# Patient Record
Sex: Female | Born: 1995 | Race: White | Hispanic: No | Marital: Married | Smoking: Never smoker
Health system: Southern US, Community
[De-identification: ages and names within clinical notes are randomized; demographics above are authoritative.]

## PROBLEM LIST (undated history)

## (undated) ENCOUNTER — Inpatient Hospital Stay (HOSPITAL_COMMUNITY): Payer: Self-pay

## (undated) DIAGNOSIS — E611 Iron deficiency: Secondary | ICD-10-CM

## (undated) DIAGNOSIS — Z9109 Other allergy status, other than to drugs and biological substances: Secondary | ICD-10-CM

## (undated) DIAGNOSIS — J45909 Unspecified asthma, uncomplicated: Secondary | ICD-10-CM

## (undated) DIAGNOSIS — F988 Other specified behavioral and emotional disorders with onset usually occurring in childhood and adolescence: Secondary | ICD-10-CM

## (undated) HISTORY — PX: NO PAST SURGERIES: SHX2092

## (undated) HISTORY — PX: WISDOM TOOTH EXTRACTION: SHX21

---
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
## 2018-03-21 LAB — OB RESULTS CONSOLE ABO/RH: RH Type: POSITIVE

## 2018-03-21 LAB — OB RESULTS CONSOLE HEPATITIS B SURFACE ANTIGEN: Hepatitis B Surface Ag: NEGATIVE

## 2018-03-21 LAB — OB RESULTS CONSOLE RUBELLA ANTIBODY, IGM: Rubella: IMMUNE

## 2018-03-21 LAB — OB RESULTS CONSOLE HIV ANTIBODY (ROUTINE TESTING): HIV: NONREACTIVE

## 2018-03-21 LAB — OB RESULTS CONSOLE GC/CHLAMYDIA
Chlamydia: NEGATIVE
Gonorrhea: NEGATIVE

## 2018-03-21 LAB — OB RESULTS CONSOLE RPR: RPR: NONREACTIVE

## 2018-07-06 ENCOUNTER — Inpatient Hospital Stay (HOSPITAL_COMMUNITY)
Admission: AD | Admit: 2018-07-06 | Discharge: 2018-07-06 | Disposition: A | Discharge status: Home / Self Care | Payer: BLUE CROSS/BLUE SHIELD | Source: Ambulatory Visit | Attending: Obstetrics and Gynecology | Admitting: Obstetrics and Gynecology

## 2018-07-06 ENCOUNTER — Encounter (HOSPITAL_COMMUNITY): Payer: Self-pay | Admitting: *Deleted

## 2018-07-06 DIAGNOSIS — R109 Unspecified abdominal pain: Secondary | ICD-10-CM

## 2018-07-06 DIAGNOSIS — Z3A24 24 weeks gestation of pregnancy: Secondary | ICD-10-CM | POA: Insufficient documentation

## 2018-07-06 DIAGNOSIS — O9A212 Injury, poisoning and certain other consequences of external causes complicating pregnancy, second trimester: Secondary | ICD-10-CM

## 2018-07-06 DIAGNOSIS — Z79899 Other long term (current) drug therapy: Secondary | ICD-10-CM | POA: Diagnosis not present

## 2018-07-06 DIAGNOSIS — R1031 Right lower quadrant pain: Secondary | ICD-10-CM | POA: Diagnosis present

## 2018-07-06 DIAGNOSIS — R102 Pelvic and perineal pain: Secondary | ICD-10-CM | POA: Diagnosis not present

## 2018-07-06 DIAGNOSIS — Z880 Allergy status to penicillin: Secondary | ICD-10-CM | POA: Diagnosis not present

## 2018-07-06 DIAGNOSIS — O26892 Other specified pregnancy related conditions, second trimester: Secondary | ICD-10-CM | POA: Diagnosis not present

## 2018-07-06 DIAGNOSIS — J45909 Unspecified asthma, uncomplicated: Secondary | ICD-10-CM | POA: Diagnosis not present

## 2018-07-06 DIAGNOSIS — O99512 Diseases of the respiratory system complicating pregnancy, second trimester: Secondary | ICD-10-CM | POA: Insufficient documentation

## 2018-07-06 DIAGNOSIS — N949 Unspecified condition associated with female genital organs and menstrual cycle: Secondary | ICD-10-CM

## 2018-07-06 LAB — URINALYSIS, ROUTINE W REFLEX MICROSCOPIC
BILIRUBIN URINE: NEGATIVE
Glucose, UA: NEGATIVE mg/dL
Hgb urine dipstick: NEGATIVE
Ketones, ur: NEGATIVE mg/dL
LEUKOCYTES UA: NEGATIVE
Nitrite: NEGATIVE
PH: 7 (ref 5.0–8.0)
PROTEIN: NEGATIVE mg/dL
Specific Gravity, Urine: 1.004 — ABNORMAL LOW (ref 1.005–1.030)

## 2018-07-06 MED ORDER — ACETAMINOPHEN 500 MG PO TABS
1000.0000 mg | ORAL_TABLET | Freq: Four times a day (QID) | ORAL | Status: DC | PRN
  Filled 2018-07-06: qty 2

## 2018-07-06 NOTE — MAU Note (Signed)
Pt reports sharp stabbing pain that started right after a prenatal massage today. Reports pain is worsening now, denies bleeding.

## 2018-07-06 NOTE — MAU Provider Note (Signed)
History     CSN: [id number]  Arrival date and time: 07/06/18 1728   First Provider Initiated Contact with Patient 07/06/18 1756      Chief Complaint  Patient presents with  . Abdominal Pain   G1 @24 .6 wks here with RLQ pain. Pain started as she stood up after having a prenatal massage. Describes as sharp and constant. Pain is worse with walking and climbing stairs. Rates pain 6/10. Has not taken anything for it. Reports lying on each side for 30 min for a total of 1 hr during the massage. Denies any recent fall, injury, or heavy lifting. Denies VB, LOF, cramping, or ctx. Her pregnancy has been uncomplicated.  OB History    Gravida  1   Para      Term      Preterm      AB      Living        SAB      TAB      Ectopic      Multiple      Live Births              Past Medical History:  Diagnosis Date  . ADD (attention deficit disorder)   . Asthma   . Environmental allergies   . Iron deficiency     Past Surgical History:  Procedure Laterality Date  . NO PAST SURGERIES      History reviewed. No pertinent family history.  Social History   Tobacco Use  . Smoking status: Never Smoker  . Smokeless tobacco: Never Used  Substance Use Topics  . Alcohol use: No  . Drug use: No    Allergies:  Allergies  Allergen Reactions  . Amoxicillin Rash    Has patient had a PCN reaction causing immediate rash, facial/tongue/throat swelling, SOB or lightheadedness with hypotension: Yes Has patient had a PCN reaction causing severe rash involving mucus membranes or skin necrosis: Yes Has patient had a PCN reaction that required hospitalization No Has patient had a PCN reaction occurring within the last 10 years: No If all of the above answers are "NO", then may proceed with Cephalosporin use.     Medications Prior to Admission  Medication Sig Dispense Refill Last Dose  . Prenatal Vit-Fe Fumarate-FA (MULTIVITAMIN-PRENATAL) 27-0.8 MG TABS tablet Take 1 tablet by  mouth daily at 12 noon.   07/05/2018 at Unknown time  . albuterol (PROVENTIL HFA;VENTOLIN HFA) 108 (90 Base) MCG/ACT inhaler Inhale 2 puffs into the lungs every 3 (three) hours as needed for wheezing or shortness of breath.   Unknown at Unknown time  . budesonide-formoterol (SYMBICORT) 80-4.5 MCG/ACT inhaler Inhale 2 puffs into the lungs 2 (two) times daily as needed (for shortness of breath).   Unknown at Unknown time  . Omega-3 Fatty Acids (OMEGA 3 PO) Take 2 capsules by mouth daily.   Past Week at Unknown time    Review of Systems  Gastrointestinal: Positive for abdominal pain.  Genitourinary: Negative for vaginal bleeding and vaginal discharge.   Physical Exam   Blood pressure 114/72, pulse 71, temperature 98.3 F (36.8 C), temperature source Oral, resp. rate 16, height 5\' 4"  (1.626 m), weight 80.7 kg, SpO2 99 %.  Physical Exam  Nursing note and vitals reviewed. Constitutional: She is oriented to person, place, and time. She appears well-developed and well-nourished. No distress.  HENT:  Head: Normocephalic and atraumatic.  Neck: Normal range of motion.  Cardiovascular: Normal rate.  Respiratory: Effort normal. No respiratory distress.  GI: Soft. She exhibits no distension. There is no tenderness.  gravid  Genitourinary:  Genitourinary Comments: SVE: closed/long  Musculoskeletal: Normal range of motion.  Neurological: She is alert and oriented to person, place, and time.  Skin: Skin is warm and dry.  Psychiatric: She has a normal mood and affect.  EFM: 145 bpm, mod variability, + accels, no decels Toco: none  No results found for this or any previous visit (from the past 24 hour(s)).  MAU Course  Procedures  MDM Pain consistent with RL and/or MSK. Offered Tylenol, Flexeril, and heating pad. Pt declines, prefers to wait until home. No evidence of PTL or acute abdominal process. Presentation, clinical findings, and plan discussed with Dr. Vincente Poli. Stable for discharge  home.  Assessment and Plan   1. [redacted] weeks gestation of pregnancy   2. Round ligament pain    Discharge home Follow up in OB office next week Tylenol prn Heat prn  Allergies as of 07/06/2018      Reactions   Amoxicillin Rash   Has patient had a PCN reaction causing immediate rash, facial/tongue/throat swelling, SOB or lightheadedness with hypotension: Yes Has patient had a PCN reaction causing severe rash involving mucus membranes or skin necrosis: Yes Has patient had a PCN reaction that required hospitalization No Has patient had a PCN reaction occurring within the last 10 years: No If all of the above answers are "NO", then may proceed with Cephalosporin use.      Medication List    TAKE these medications   albuterol 108 (90 Base) MCG/ACT inhaler Commonly known as:  PROVENTIL HFA;VENTOLIN HFA Inhale 2 puffs into the lungs every 3 (three) hours as needed for wheezing or shortness of breath.   budesonide-formoterol 80-4.5 MCG/ACT inhaler Commonly known as:  SYMBICORT Inhale 2 puffs into the lungs 2 (two) times daily as needed (for shortness of breath).   multivitamin-prenatal 27-0.8 MG Tabs tablet Take 1 tablet by mouth daily at 12 noon.   OMEGA 3 PO Take 2 capsules by mouth daily.      Donette Larry, CNM 07/06/2018, 6:14 PM

## 2018-07-06 NOTE — Discharge Instructions (Signed)

## 2018-09-24 ENCOUNTER — Encounter (HOSPITAL_COMMUNITY): Payer: Self-pay

## 2018-09-24 ENCOUNTER — Encounter (HOSPITAL_COMMUNITY): Payer: Self-pay | Admitting: *Deleted

## 2018-09-24 ENCOUNTER — Inpatient Hospital Stay (HOSPITAL_COMMUNITY)
Admission: AD | Admit: 2018-09-24 | Discharge: 2018-09-27 | DRG: 807 | Disposition: A | Discharge status: Home / Self Care | Payer: BLUE CROSS/BLUE SHIELD | Attending: Obstetrics and Gynecology | Admitting: Obstetrics and Gynecology

## 2018-09-24 ENCOUNTER — Inpatient Hospital Stay (HOSPITAL_COMMUNITY)
Admission: AD | Admit: 2018-09-24 | Discharge: 2018-09-24 | Disposition: A | Discharge status: Home / Self Care | Payer: BLUE CROSS/BLUE SHIELD | Source: Ambulatory Visit | Attending: Obstetrics and Gynecology | Admitting: Obstetrics and Gynecology

## 2018-09-24 ENCOUNTER — Inpatient Hospital Stay (EMERGENCY_DEPARTMENT_HOSPITAL)
Admission: AD | Admit: 2018-09-24 | Discharge: 2018-09-24 | Disposition: A | Discharge status: Home / Self Care | Payer: BLUE CROSS/BLUE SHIELD | Source: Ambulatory Visit | Attending: Obstetrics and Gynecology | Admitting: Obstetrics and Gynecology

## 2018-09-24 DIAGNOSIS — O99214 Obesity complicating childbirth: Secondary | ICD-10-CM | POA: Diagnosis present

## 2018-09-24 DIAGNOSIS — O4703 False labor before 37 completed weeks of gestation, third trimester: Secondary | ICD-10-CM | POA: Insufficient documentation

## 2018-09-24 DIAGNOSIS — Z3A36 36 weeks gestation of pregnancy: Secondary | ICD-10-CM | POA: Insufficient documentation

## 2018-09-24 DIAGNOSIS — O479 False labor, unspecified: Secondary | ICD-10-CM

## 2018-09-24 DIAGNOSIS — O99824 Streptococcus B carrier state complicating childbirth: Principal | ICD-10-CM | POA: Diagnosis present

## 2018-09-24 DIAGNOSIS — D649 Anemia, unspecified: Secondary | ICD-10-CM | POA: Diagnosis present

## 2018-09-24 DIAGNOSIS — O9902 Anemia complicating childbirth: Secondary | ICD-10-CM | POA: Diagnosis present

## 2018-09-24 DIAGNOSIS — Z0371 Encounter for suspected problem with amniotic cavity and membrane ruled out: Secondary | ICD-10-CM

## 2018-09-24 DIAGNOSIS — E669 Obesity, unspecified: Secondary | ICD-10-CM | POA: Diagnosis present

## 2018-09-24 LAB — URINALYSIS, ROUTINE W REFLEX MICROSCOPIC
BILIRUBIN URINE: NEGATIVE
Glucose, UA: NEGATIVE mg/dL
HGB URINE DIPSTICK: NEGATIVE
Ketones, ur: NEGATIVE mg/dL
Leukocytes, UA: NEGATIVE
NITRITE: NEGATIVE
PROTEIN: NEGATIVE mg/dL
SPECIFIC GRAVITY, URINE: 1.018 (ref 1.005–1.030)
pH: 6 (ref 5.0–8.0)

## 2018-09-24 LAB — AMNISURE RUPTURE OF MEMBRANE (ROM) NOT AT ARMC: AMNISURE: NEGATIVE

## 2018-09-24 MED ORDER — NIFEDIPINE 10 MG PO CAPS
10.0000 mg | ORAL_CAPSULE | Freq: Once | ORAL | Status: AC
  Administered 2018-09-24: 10 mg via ORAL
  Filled 2018-09-24: qty 1

## 2018-09-24 MED ORDER — NIFEDIPINE 10 MG PO CAPS
10.0000 mg | ORAL_CAPSULE | ORAL | Status: AC
  Filled 2018-09-24: qty 1

## 2018-09-24 MED ORDER — PROMETHAZINE HCL 25 MG/ML IJ SOLN
12.5000 mg | Freq: Once | INTRAMUSCULAR | Status: AC
  Administered 2018-09-24: 12.5 mg via INTRAMUSCULAR
  Filled 2018-09-24: qty 1

## 2018-09-24 MED ORDER — MORPHINE SULFATE (PF) 4 MG/ML IV SOLN: 4.0000 mg | Freq: Once | INTRAVENOUS | Status: DC

## 2018-09-24 MED ORDER — PROMETHAZINE HCL 25 MG/ML IJ SOLN: 25.0000 mg | Freq: Once | INTRAMUSCULAR | Status: DC

## 2018-09-24 MED ORDER — NALBUPHINE HCL 10 MG/ML IJ SOLN
10.0000 mg | Freq: Once | INTRAMUSCULAR | Status: AC
Start: 2018-09-24 — End: 2018-09-24
  Administered 2018-09-24: 10 mg via INTRAMUSCULAR
  Filled 2018-09-24: qty 1

## 2018-09-24 NOTE — MAU Note (Addendum)
PT SAYS HAS HAD UC   SINCE  0145.    LAST Tuesday - [facility]  IN OFFICE    CLOSED .    DENIES HSV AND MRSA.   COLLECTED   GBS-  DOESN'T KNOW RESULTS.    SAYS AT 0300- SHE SAT ON BED  TO GO TO B-ROOM   FLUID CAME  OUT - ON UNDERWEAR AND  BED-   AND  NONE  SINCE.

## 2018-09-24 NOTE — MAU Note (Signed)
Urine sent to lab 

## 2018-09-24 NOTE — Discharge Instructions (Signed)
Braxton Hicks Contractions °Contractions of the uterus can occur throughout pregnancy, but they are not always a sign that you are in labor. You may have practice contractions called Braxton Hicks contractions. These false labor contractions are sometimes confused with true labor. °What are Braxton Hicks contractions? °Braxton Hicks contractions are tightening movements that occur in the muscles of the uterus before labor. Unlike true labor contractions, these contractions do not result in opening (dilation) and thinning of the cervix. Toward the end of pregnancy (32-34 weeks), Braxton Hicks contractions can happen more often and may become stronger. These contractions are sometimes difficult to tell apart from true labor because they can be very uncomfortable. You should not feel embarrassed if you go to the hospital with false labor. °Sometimes, the only way to tell if you are in true labor is for your health care provider to look for changes in the cervix. The health care provider will do a physical exam and may monitor your contractions. If you are not in true labor, the exam should show that your cervix is not dilating and your water has not broken. °If there are other health problems associated with your pregnancy, it is completely safe for you to be sent home with false labor. You may continue to have Braxton Hicks contractions until you go into true labor. °How to tell the difference between true labor and false labor °True labor °· Contractions last 30-70 seconds. °· Contractions become very regular. °· Discomfort is usually felt in the top of the uterus, and it spreads to the lower abdomen and low back. °· Contractions do not go away with walking. °· Contractions usually become more intense and increase in frequency. °· The cervix dilates and gets thinner. °False labor °· Contractions are usually shorter and not as strong as true labor contractions. °· Contractions are usually irregular. °· Contractions  are often felt in the front of the lower abdomen and in the groin. °· Contractions may go away when you walk around or change positions while lying down. °· Contractions get weaker and are shorter-lasting as time goes on. °· The cervix usually does not dilate or become thin. °Follow these instructions at home: °· Take over-the-counter and prescription medicines only as told by your health care provider. °· Keep up with your usual exercises and follow other instructions from your health care provider. °· Eat and drink lightly if you think you are going into labor. °· If Braxton Hicks contractions are making you uncomfortable: °? Change your position from lying down or resting to walking, or change from walking to resting. °? Sit and rest in a tub of warm water. °? Drink enough fluid to keep your urine pale yellow. Dehydration may cause these contractions. °? Do slow and deep breathing several times an hour. °· Keep all follow-up prenatal visits as told by your health care provider. This is important. °Contact a health care provider if: °· You have a fever. °· You have continuous pain in your abdomen. °Get help right away if: °· Your contractions become stronger, more regular, and closer together. °· You have fluid leaking or gushing from your vagina. °· You pass blood-tinged mucus (bloody show). °· You have bleeding from your vagina. °· You have low back pain that you never had before. °· You feel your baby’s head pushing down and causing pelvic pressure. °· Your baby is not moving inside you as much as it used to. °Summary °· Contractions that occur before labor are called Braxton   Hicks contractions, false labor, or practice contractions. °· Braxton Hicks contractions are usually shorter, weaker, farther apart, and less regular than true labor contractions. True labor contractions usually become progressively stronger and regular and they become more frequent. °· Manage discomfort from Braxton Hicks contractions by  changing position, resting in a warm bath, drinking plenty of water, or practicing deep breathing. °This information is not intended to replace advice given to you by your health care provider. Make sure you discuss any questions you have with your health care provider. °Document Released: 02/23/2017 Document Revised: 02/23/2017 Document Reviewed: 02/23/2017 °Elsevier Interactive Patient Education © 2018 Elsevier Inc. ° °

## 2018-09-24 NOTE — MAU Provider Note (Addendum)
History   [id number]   Chief Complaint  Patient presents with  . Contractions    HPI Julie Fitzgerald is a 22 y.o. female  G1P0 @36 .2 wks here with report of ctx and LOF.  Leaking of fluid happened around 0300 and soaked her clothes. LOF has not continued. Pt reports regular contractions since 0145. She denies vaginal bleeding. Last intercourse was not recent. She reports good fetal movement. All other systems negative.    No LMP recorded. Patient is pregnant.  OB History  Gravida Para Term Preterm AB Living  1            SAB TAB Ectopic Multiple Live Births               # Outcome Date GA Lbr Len/2nd Weight Sex Delivery Anes PTL Lv  1 Current             Past Medical History:  Diagnosis Date  . ADD (attention deficit disorder)   . Asthma   . Environmental allergies   . Iron deficiency     History reviewed. No pertinent family history.  Social History   Socioeconomic History  . Marital status: Single    Spouse name: Not on file  . Number of children: Not on file  . Years of education: Not on file  . Highest education level: Not on file  Occupational History  . Not on file  Social Needs  . Financial resource strain: Not on file  . Food insecurity:    Worry: Not on file    Inability: Not on file  . Transportation needs:    Medical: Not on file    Non-medical: Not on file  Tobacco Use  . Smoking status: Never Smoker  . Smokeless tobacco: Never Used  Substance and Sexual Activity  . Alcohol use: No  . Drug use: No  . Sexual activity: Yes    Birth control/protection: Pill  Lifestyle  . Physical activity:    Days per week: Not on file    Minutes per session: Not on file  . Stress: Not on file  Relationships  . Social connections:    Talks on phone: Not on file    Gets together: Not on file    Attends religious service: Not on file    Active member of club or organization: Not on file    Attends meetings of clubs or organizations: Not on file     Relationship status: Not on file  Other Topics Concern  . Not on file  Social History Narrative  . Not on file    Allergies  Allergen Reactions  . Amoxicillin Rash    Has patient had a PCN reaction causing immediate rash, facial/tongue/throat swelling, SOB or lightheadedness with hypotension: Yes Has patient had a PCN reaction causing severe rash involving mucus membranes or skin necrosis: Yes Has patient had a PCN reaction that required hospitalization No Has patient had a PCN reaction occurring within the last 10 years: No If all of the above answers are "NO", then may proceed with Cephalosporin use.     No current facility-administered medications on file prior to encounter.    Current Outpatient Medications on File Prior to Encounter  Medication Sig Dispense Refill  . Prenatal Vit-Fe Fumarate-FA (MULTIVITAMIN-PRENATAL) 27-0.8 MG TABS tablet Take 1 tablet by mouth daily at 12 noon.    Marland Kitchen. albuterol (PROVENTIL HFA;VENTOLIN HFA) 108 (90 Base) MCG/ACT inhaler Inhale 2 puffs into the lungs every 3 (three)  hours as needed for wheezing or shortness of breath.    . budesonide-formoterol (SYMBICORT) 80-4.5 MCG/ACT inhaler Inhale 2 puffs into the lungs 2 (two) times daily as needed (for shortness of breath).    . Omega-3 Fatty Acids (OMEGA 3 PO) Take 2 capsules by mouth daily.       Review of Systems  Gastrointestinal: Positive for abdominal pain.  Genitourinary: Positive for vaginal discharge. Negative for vaginal bleeding.     Physical Exam   Vitals:   09/24/18 0412 09/24/18 0420  BP:  120/79  Pulse:  (!) 115  Resp:  20  Temp:  98.6 F (37 C)  Weight: 87.5 kg   Height: 5\' 4"  (1.626 m)     Physical Exam  Constitutional: She is oriented to person, place, and time. She appears well-developed and well-nourished. No distress.  HENT:  Head: Normocephalic and atraumatic.  Neck: Normal range of motion.  Respiratory: Effort normal. No respiratory distress.  Genitourinary:   Genitourinary Comments: SSE: no pool, fern SVE closed/60  Musculoskeletal: Normal range of motion.  Neurological: She is alert and oriented to person, place, and time.  Skin: Skin is warm and dry.  Psychiatric: She has a normal mood and affect.  EFM: 155 bpm, mod variability, + accels, no decels Toco: 2-4  Results for orders placed or performed during the hospital encounter of 09/24/18 (from the past 24 hour(s))  Amnisure rupture of membrane (rom)not at [facility]     Status: None   Collection Time: 09/24/18  5:01 AM  Result Value Ref Range   Amnisure ROM NEGATIVE   Urinalysis, Routine w reflex microscopic     Status: Abnormal   Collection Time: 09/24/18  5:08 AM  Result Value Ref Range   Color, Urine YELLOW YELLOW   APPearance HAZY (A) CLEAR   Specific Gravity, Urine 1.018 1.005 - 1.030   pH 6.0 5.0 - 8.0   Glucose, UA NEGATIVE NEGATIVE mg/dL   Hgb urine dipstick NEGATIVE NEGATIVE   Bilirubin Urine NEGATIVE NEGATIVE   Ketones, ur NEGATIVE NEGATIVE mg/dL   Protein, ur NEGATIVE NEGATIVE mg/dL   Nitrite NEGATIVE NEGATIVE   Leukocytes, UA NEGATIVE NEGATIVE   MAU Course  Procedures Po hydration Procardia x1  MDM Labs ordered and reviewed. No evidence of SROM. Fern negative. Procardia given for comfort.  0630: More uncomfortable after first dose Procardia, SVE recheck, now 1 cm, will continue to monitor for PTL  0740: cervix recheck, unchanged but continues to breath through ctx q2-4, consult with Dr. Renaldo Fiddler, recommend another dose Procardia and recheck in 1 hr  Transfer of care given to [facility], FNP Donette Larry, CNM  09/24/2018 8:10 AM   Cervix unchanged. Ctx spaced out with some runs of more frequent contractions. Patient reports she feels like they have spaced out. Rates pain 5/10 when it occurs. Has appt in office on Thursday.  Updated Dr. Henderson Cloud regarding pt's exams & FHT/TOCO. Pt to be discharged home.  Assessment and Plan  A: 1. Encounter for suspected PROM, with  rupture of membranes not found   2. False labor   3. [redacted] weeks gestation of pregnancy    P: Discharge home in stable condition Discussed reasons to return to MAU Keep f/u with OB this week  Judeth Horn, NP

## 2018-09-24 NOTE — MAU Note (Signed)
Pt is very anxious. Declined to take medication prescribed for rest because she said she doesn't like when medication makes her feel out of it. Says it makes her anxious. I asked if she has ever taken anything for anxiety and she said no.

## 2018-09-24 NOTE — MAU Note (Signed)
Pt reports contractions that started at 0145 and are now every 6-7 min. Pt reports that at 0300 she "felt like she needed to pee and then had a gush of clear fluid come out. Unsure if I peed on myself." Pt denies wearing a pad in triage or any other gushes of clear fluid. Denies vaginal bleeding. Reports good fetal movement. Cervix was closed on last exam.

## 2018-09-24 NOTE — MAU Note (Signed)
I have communicated with Dr. Aneta MinsPhillip and reviewed vital signs:  Vitals:   09/24/18 1734 09/24/18 1735  BP:    Pulse:    SpO2: 100% 99%    Vaginal exam:  Dilation: Fingertip Effacement (%): Thick Cervical Position: Posterior Station: -3, -2 Exam by:: Ginnie Smartachel Dana Dorner RN,   Also reviewed contraction pattern and that non-stress test is reactive.  It has been documented that patient is contracting irregularly with no cervical change since she was discharged earlier in the day not indicating active labor.  Patient denies any other complaints.  Based on this report provider has given order for discharge.  A discharge order and diagnosis entered by a provider.   Labor discharge instructions reviewed with patient.     Reviewed tips on helping patient rest including warm baths, heating pad on her back, and benadryl for sleep. Also reviewed with patient to hydrate.

## 2018-09-24 NOTE — MAU Note (Signed)
Pt here for contractions every 2 mins. Denies LOF or vaginal bleeding. Good fetal movement. Cercvix was 1cm earlier today

## 2018-09-24 NOTE — MAU Note (Signed)
Pt here earlier and back contracting again.

## 2018-09-24 NOTE — Discharge Instructions (Signed)
Braxton Hicks Contractions °Contractions of the uterus can occur throughout pregnancy, but they are not always a sign that you are in labor. You may have practice contractions called Braxton Hicks contractions. These false labor contractions are sometimes confused with true labor. °What are Braxton Hicks contractions? °Braxton Hicks contractions are tightening movements that occur in the muscles of the uterus before labor. Unlike true labor contractions, these contractions do not result in opening (dilation) and thinning of the cervix. Toward the end of pregnancy (32-34 weeks), Braxton Hicks contractions can happen more often and may become stronger. These contractions are sometimes difficult to tell apart from true labor because they can be very uncomfortable. You should not feel embarrassed if you go to the hospital with false labor. °Sometimes, the only [place] to tell if you are in true labor is for your health care provider to look for changes in the cervix. The health care provider will do a physical exam and may monitor your contractions. If you are not in true labor, the exam should show that your cervix is not dilating and your water has not broken. °If there are other health problems associated with your pregnancy, it is completely safe for you to be sent home with false labor. You may continue to have Braxton Hicks contractions until you go into true labor. °How to tell the difference between true labor and false labor °True labor °· Contractions last 30-70 seconds. °· Contractions become very regular. °· Discomfort is usually felt in the top of the uterus, and it spreads to the lower abdomen and low back. °· Contractions do not go away with walking. °· Contractions usually become more intense and increase in frequency. °· The cervix dilates and gets thinner. °False labor °· Contractions are usually shorter and not as strong as true labor contractions. °· Contractions are usually irregular. °· Contractions  are often felt in the front of the lower abdomen and in the groin. °· Contractions may go away when you walk around or change positions while lying down. °· Contractions get weaker and are shorter-lasting as time goes on. °· The cervix usually does not dilate or become thin. °Follow these instructions at home: °· Take over-the-counter and prescription medicines only as told by your health care provider. °· Keep up with your usual exercises and follow other instructions from your health care provider. °· Eat and drink lightly if you think you are going into labor. °· If Braxton Hicks contractions are making you uncomfortable: °? Change your position from lying down or resting to walking, or change from walking to resting. °? Sit and rest in a tub of warm water. °? Drink enough fluid to keep your urine pale yellow. Dehydration may cause these contractions. °? Do slow and deep breathing several times an hour. °· Keep all follow-up prenatal visits as told by your health care provider. This is important. °Contact a health care provider if: °· You have a fever. °· You have continuous pain in your abdomen. °Get help right away if: °· Your contractions become stronger, more regular, and closer together. °· You have fluid leaking or gushing from your vagina. °· You have bleeding from your vagina. °· You have low back pain that you never had before. °· You feel your baby’s head pushing down and causing pelvic pressure. °· Your baby is not moving inside you as much as it used to. °Summary °· Contractions that occur before labor are called Braxton Hicks contractions, false labor, or practice contractions. °·   Braxton Hicks contractions are usually shorter, weaker, farther apart, and less regular than true labor contractions. True labor contractions usually become progressively stronger and regular and they become more frequent.  Manage discomfort from [facility] contractions by changing position, resting in a warm bath,  drinking plenty of water, or practicing deep breathing. This information is not intended to replace advice given to you by your health care provider. Make sure you discuss any questions you have with your health care provider. Document Released: 02/23/2017 Document Revised: 02/23/2017 Document Reviewed: 02/23/2017 Elsevier Interactive Patient Education  2018 Elsevier Inc.  Fetal Movement Counts Patient Name: ________________________________________________ Patient Due Date: ____________________ What is a fetal movement count? A fetal movement count is the number of times that you feel your baby move during a certain amount of time. This may also be called a fetal kick count. A fetal movement count is recommended for every pregnant woman. You may be asked to start counting fetal movements as early as week 28 of your pregnancy. Pay attention to when your baby is most active. You may notice your baby's sleep and wake cycles. You may also notice things that make your baby move more. You should do a fetal movement count:  When your baby is normally most active.  At the same time each day.  A good time to count movements is while you are resting, after having something to eat and drink. How do I count fetal movements? 1. Find a quiet, comfortable area. Sit, or lie down on your side. 2. Write down the date, the start time and stop time, and the number of movements that you felt between those two times. Take this information with you to your health care visits. 3. For 2 hours, count kicks, flutters, swishes, rolls, and jabs. You should feel at least 10 movements during 2 hours. 4. You may stop counting after you have felt 10 movements. 5. If you do not feel 10 movements in 2 hours, have something to eat and drink. Then, keep resting and counting for 1 hour. If you feel at least 4 movements during that hour, you may stop counting. Contact a health care provider if:  You feel fewer than 4 movements in  2 hours.  Your baby is not moving like he or she usually does. Date: ____________ Start time: ____________ Stop time: ____________ Movements: ____________ Date: ____________ Start time: ____________ Stop time: ____________ Movements: ____________ Date: ____________ Start time: ____________ Stop time: ____________ Movements: ____________ Date: ____________ Start time: ____________ Stop time: ____________ Movements: ____________ Date: ____________ Start time: ____________ Stop time: ____________ Movements: ____________ Date: ____________ Start time: ____________ Stop time: ____________ Movements: ____________ Date: ____________ Start time: ____________ Stop time: ____________ Movements: ____________ Date: ____________ Start time: ____________ Stop time: ____________ Movements: ____________ Date: ____________ Start time: ____________ Stop time: ____________ Movements: ____________ This information is not intended to replace advice given to you by your health care provider. Make sure you discuss any questions you have with your health care provider. Document Released: 11/09/2006 Document Revised: 06/08/2016 Document Reviewed: 11/19/2015 Elsevier Interactive Patient Education  [employer]2018 Elsevier Inc.

## 2018-09-25 ENCOUNTER — Other Ambulatory Visit: Payer: Self-pay

## 2018-09-25 ENCOUNTER — Inpatient Hospital Stay (HOSPITAL_COMMUNITY): Payer: BLUE CROSS/BLUE SHIELD | Admitting: Anesthesiology

## 2018-09-25 ENCOUNTER — Encounter (HOSPITAL_COMMUNITY): Payer: Self-pay | Admitting: Anesthesiology

## 2018-09-25 DIAGNOSIS — Z3483 Encounter for supervision of other normal pregnancy, third trimester: Secondary | ICD-10-CM | POA: Diagnosis present

## 2018-09-25 DIAGNOSIS — O99214 Obesity complicating childbirth: Secondary | ICD-10-CM | POA: Diagnosis present

## 2018-09-25 DIAGNOSIS — E669 Obesity, unspecified: Secondary | ICD-10-CM | POA: Diagnosis present

## 2018-09-25 DIAGNOSIS — O9902 Anemia complicating childbirth: Secondary | ICD-10-CM | POA: Diagnosis present

## 2018-09-25 DIAGNOSIS — Z3A36 36 weeks gestation of pregnancy: Secondary | ICD-10-CM | POA: Diagnosis not present

## 2018-09-25 DIAGNOSIS — O99824 Streptococcus B carrier state complicating childbirth: Secondary | ICD-10-CM | POA: Diagnosis present

## 2018-09-25 DIAGNOSIS — D649 Anemia, unspecified: Secondary | ICD-10-CM | POA: Diagnosis present

## 2018-09-25 LAB — ABO/RH: ABO/RH(D): A POS

## 2018-09-25 LAB — CBC
HCT: 37.4 % (ref 36.0–46.0)
Hemoglobin: 12.2 g/dL (ref 12.0–15.0)
MCH: 28.8 pg (ref 26.0–34.0)
MCHC: 32.6 g/dL (ref 30.0–36.0)
MCV: 88.2 fL (ref 80.0–100.0)
Platelets: 284 10*3/uL (ref 150–400)
RBC: 4.24 MIL/uL (ref 3.87–5.11)
RDW: 13.9 % (ref 11.5–15.5)
WBC: 17.7 10*3/uL — ABNORMAL HIGH (ref 4.0–10.5)
nRBC: 0 % (ref 0.0–0.2)

## 2018-09-25 LAB — RPR: RPR Ser Ql: NONREACTIVE

## 2018-09-25 LAB — TYPE AND SCREEN
ABO/RH(D): A POS
Antibody Screen: NEGATIVE

## 2018-09-25 MED ORDER — DIPHENHYDRAMINE HCL 50 MG/ML IJ SOLN: 12.5000 mg | INTRAMUSCULAR | Status: DC | PRN

## 2018-09-25 MED ORDER — WITCH HAZEL-GLYCERIN EX PADS: 1.0000 "application " | MEDICATED_PAD | CUTANEOUS | Status: DC | PRN

## 2018-09-25 MED ORDER — OXYTOCIN BOLUS FROM INFUSION
500.0000 mL | Freq: Once | INTRAVENOUS | Status: AC
  Administered 2018-09-25: 500 mL via INTRAVENOUS

## 2018-09-25 MED ORDER — SOD CITRATE-CITRIC ACID 500-334 MG/5ML PO SOLN: 30.0000 mL | ORAL | Status: DC | PRN

## 2018-09-25 MED ORDER — BUTORPHANOL TARTRATE 1 MG/ML IJ SOLN: 1.0000 mg | INTRAMUSCULAR | Status: DC | PRN

## 2018-09-25 MED ORDER — ONDANSETRON HCL 4 MG PO TABS: 4.0000 mg | ORAL_TABLET | ORAL | Status: DC | PRN

## 2018-09-25 MED ORDER — ACETAMINOPHEN 325 MG PO TABS: 650.0000 mg | ORAL_TABLET | ORAL | Status: DC | PRN

## 2018-09-25 MED ORDER — ONDANSETRON HCL 4 MG/2ML IJ SOLN: 4.0000 mg | Freq: Four times a day (QID) | INTRAMUSCULAR | Status: DC | PRN

## 2018-09-25 MED ORDER — DIBUCAINE 1 % RE OINT: 1.0000 "application " | TOPICAL_OINTMENT | RECTAL | Status: DC | PRN

## 2018-09-25 MED ORDER — LACTATED RINGERS IV SOLN: 500.0000 mL | INTRAVENOUS | Status: DC | PRN

## 2018-09-25 MED ORDER — OXYCODONE-ACETAMINOPHEN 5-325 MG PO TABS: 2.0000 | ORAL_TABLET | ORAL | Status: DC | PRN

## 2018-09-25 MED ORDER — MEDROXYPROGESTERONE ACETATE 150 MG/ML IM SUSP: 150.0000 mg | INTRAMUSCULAR | Status: DC | PRN

## 2018-09-25 MED ORDER — FENTANYL 2.5 MCG/ML BUPIVACAINE 1/10 % EPIDURAL INFUSION (WH - ANES)
14.0000 mL/h | INTRAMUSCULAR | Status: DC | PRN
  Administered 2018-09-25 (×2): 14 mL/h via EPIDURAL
  Filled 2018-09-25: qty 100

## 2018-09-25 MED ORDER — VANCOMYCIN HCL IN DEXTROSE 1-5 GM/200ML-% IV SOLN
1000.0000 mg | Freq: Two times a day (BID) | INTRAVENOUS | Status: DC
  Administered 2018-09-25: 1000 mg via INTRAVENOUS
  Filled 2018-09-25 (×3): qty 200

## 2018-09-25 MED ORDER — BETAMETHASONE SOD PHOS & ACET 6 (3-3) MG/ML IJ SUSP
12.0000 mg | Freq: Once | INTRAMUSCULAR | Status: AC
  Administered 2018-09-25: 12 mg via INTRAMUSCULAR
  Filled 2018-09-25: qty 2

## 2018-09-25 MED ORDER — MEASLES, MUMPS & RUBELLA VAC IJ SOLR
0.5000 mL | Freq: Once | INTRAMUSCULAR | Status: DC
  Filled 2018-09-25: qty 0.5

## 2018-09-25 MED ORDER — BENZOCAINE-MENTHOL 20-0.5 % EX AERO
1.0000 "application " | INHALATION_SPRAY | CUTANEOUS | Status: DC | PRN
  Administered 2018-09-25: 1 via TOPICAL
  Filled 2018-09-25: qty 56

## 2018-09-25 MED ORDER — OXYCODONE-ACETAMINOPHEN 5-325 MG PO TABS: 1.0000 | ORAL_TABLET | ORAL | Status: DC | PRN

## 2018-09-25 MED ORDER — LACTATED RINGERS IV SOLN
500.0000 mL | Freq: Once | INTRAVENOUS | Status: AC
  Administered 2018-09-25: 500 mL via INTRAVENOUS

## 2018-09-25 MED ORDER — COCONUT OIL OIL
1.0000 "application " | TOPICAL_OIL | Status: DC | PRN
  Filled 2018-09-25: qty 120

## 2018-09-25 MED ORDER — FENTANYL 2.5 MCG/ML BUPIVACAINE 1/10 % EPIDURAL INFUSION (WH - ANES)
INTRAMUSCULAR | Status: AC
  Filled 2018-09-25: qty 100

## 2018-09-25 MED ORDER — PRENATAL MULTIVITAMIN CH
1.0000 | ORAL_TABLET | Freq: Every day | ORAL | Status: DC
  Administered 2018-09-26 – 2018-09-27 (×2): 1 via ORAL
  Filled 2018-09-25 (×2): qty 1

## 2018-09-25 MED ORDER — SENNOSIDES-DOCUSATE SODIUM 8.6-50 MG PO TABS
2.0000 | ORAL_TABLET | ORAL | Status: DC
  Administered 2018-09-25 – 2018-09-26 (×2): 2 via ORAL
  Filled 2018-09-25 (×3): qty 2

## 2018-09-25 MED ORDER — TETANUS-DIPHTH-ACELL PERTUSSIS 5-2.5-18.5 LF-MCG/0.5 IM SUSP: 0.5000 mL | Freq: Once | INTRAMUSCULAR | Status: DC

## 2018-09-25 MED ORDER — ALBUTEROL SULFATE (2.5 MG/3ML) 0.083% IN NEBU: 2.5000 mg | INHALATION_SOLUTION | RESPIRATORY_TRACT | Status: DC | PRN

## 2018-09-25 MED ORDER — LACTATED RINGERS IV SOLN: INTRAVENOUS | Status: DC

## 2018-09-25 MED ORDER — SIMETHICONE 80 MG PO CHEW
80.0000 mg | CHEWABLE_TABLET | ORAL | Status: DC | PRN
  Administered 2018-09-26: 80 mg via ORAL

## 2018-09-25 MED ORDER — EPHEDRINE 5 MG/ML INJ
INTRAVENOUS | Status: AC
  Filled 2018-09-25: qty 4

## 2018-09-25 MED ORDER — OXYTOCIN 40 UNITS IN LACTATED RINGERS INFUSION - SIMPLE MED
2.5000 [IU]/h | INTRAVENOUS | Status: DC
  Filled 2018-09-25: qty 1000

## 2018-09-25 MED ORDER — PHENYLEPHRINE 40 MCG/ML (10ML) SYRINGE FOR IV PUSH (FOR BLOOD PRESSURE SUPPORT)
80.0000 ug | PREFILLED_SYRINGE | INTRAVENOUS | Status: DC | PRN
  Filled 2018-09-25: qty 5

## 2018-09-25 MED ORDER — DIPHENHYDRAMINE HCL 25 MG PO CAPS: 25.0000 mg | ORAL_CAPSULE | Freq: Four times a day (QID) | ORAL | Status: DC | PRN

## 2018-09-25 MED ORDER — PHENYLEPHRINE 40 MCG/ML (10ML) SYRINGE FOR IV PUSH (FOR BLOOD PRESSURE SUPPORT)
PREFILLED_SYRINGE | INTRAVENOUS | Status: AC
  Filled 2018-09-25: qty 10

## 2018-09-25 MED ORDER — LIDOCAINE HCL (PF) 1 % IJ SOLN
INTRAMUSCULAR | Status: DC | PRN
  Administered 2018-09-25 (×2): 4 mL via EPIDURAL

## 2018-09-25 MED ORDER — LIDOCAINE HCL (PF) 1 % IJ SOLN
30.0000 mL | INTRAMUSCULAR | Status: DC | PRN
  Filled 2018-09-25: qty 30

## 2018-09-25 MED ORDER — IBUPROFEN 600 MG PO TABS
600.0000 mg | ORAL_TABLET | Freq: Four times a day (QID) | ORAL | Status: DC
  Administered 2018-09-25 – 2018-09-27 (×8): 600 mg via ORAL
  Filled 2018-09-25 (×8): qty 1

## 2018-09-25 MED ORDER — ONDANSETRON HCL 4 MG/2ML IJ SOLN: 4.0000 mg | INTRAMUSCULAR | Status: DC | PRN

## 2018-09-25 NOTE — H&P (Signed)
Julie QuintKatherine N Walls is a 22 y.o. female @ 36+3 wks presenting for SOL.  Pregnancy uncomplicated,  comofortable now w/ epidural.  OB History    Gravida  1   Para      Term      Preterm      AB      Living        SAB      TAB      Ectopic      Multiple      Live Births             Past Medical History:  Diagnosis Date  . ADD (attention deficit disorder)   . Asthma   . Environmental allergies   . Iron deficiency    Past Surgical History:  Procedure Laterality Date  . NO PAST SURGERIES     Family History: family history is not on file. Social History:  reports that she has never smoked. She has never used smokeless tobacco. She reports that she does not drink alcohol or use drugs.     Maternal Diabetes: No Genetic Screening: Declined Maternal Ultrasounds/Referrals: Normal Fetal Ultrasounds or other Referrals:  None Maternal Substance Abuse:  No Significant Maternal Medications:  None Significant Maternal Lab Results:  None Other Comments:  None  ROS History Dilation: 6 Effacement (%): 90 Station: -1 Exam by:: Hermenia BersMichelle Kelly, RN Blood pressure (!) 107/51, pulse 83, temperature 98 F (36.7 C), temperature source Oral, resp. rate 18, SpO2 100 %. Exam Physical Exam  Gen - NAD Abd - gravid, NT  EFW 7# Ext - NT Cvx 8cm AROM - clear Prenatal labs: ABO, Rh: --/--/A POS, A POS [facility], [address] [place], [place] [address]  [id number](12/03 0009) Antibody: NEG (12/03 0009) Rubella: Immune (05/29 0000) RPR: Nonreactive (05/29 0000)  HBsAg: Negative (05/29 0000)  HIV: Non-reactive (05/29 0000)  GBS:   +  Assessment/Plan: Admit BMZ vanc for GBS + Exp mngt   Zelphia CairoGretchen Jacelyn Cuen 09/25/2018, 8:06 AM

## 2018-09-25 NOTE — Anesthesia Postprocedure Evaluation (Signed)
Anesthesia Post Note  Patient: Julie Fitzgerald  Procedure(s) Performed: AN AD HOC LABOR EPIDURAL     Patient location during evaluation: Mother Baby Anesthesia Type: Epidural Level of consciousness: awake and alert and oriented Pain management: satisfactory to patient Vital Signs Assessment: post-procedure vital signs reviewed and stable Respiratory status: respiratory function stable Cardiovascular status: stable Postop Assessment: no headache, no backache, epidural receding, patient able to bend at knees, no signs of nausea or vomiting and adequate PO intake Anesthetic complications: no    Last Vitals:  Vitals:   09/25/18 1430 09/25/18 1744  BP: 109/73 113/60  Pulse: 94 92  Resp: 18 18  Temp: 37.5 C 36.9 C  SpO2:      Last Pain:  Vitals:   09/25/18 1744  TempSrc: Oral  PainSc: 0-No pain   Pain Goal:                 [name]

## 2018-09-25 NOTE — Anesthesia Procedure Notes (Signed)
Epidural Patient location during procedure: OB Start time: 09/25/2018 12:53 AM End time: 09/25/2018 1:05 AM  Staffing Anesthesiologist: Mal AmabileFoster, Calvina Liptak, MD Performed: anesthesiologist   Preanesthetic Checklist Completed: patient identified, site marked, surgical consent, pre-op evaluation, timeout performed, IV checked, risks and benefits discussed and monitors and equipment checked  Epidural Patient position: sitting Prep: site prepped and draped and DuraPrep Patient monitoring: continuous pulse ox and blood pressure Approach: midline Location: L3-L4 Injection technique: LOR air  Needle:  Needle type: Tuohy  Needle gauge: 17 G Needle length: 9 cm and 9 Needle insertion depth: 5 cm cm Catheter type: closed end flexible Catheter size: 19 Gauge Catheter at skin depth: 10 cm Test dose: negative and Other  Assessment Events: blood not aspirated, injection not painful, no injection resistance, negative IV test and no paresthesia  Additional Notes Patient identified. Risks and benefits discussed including failed block, incomplete  Pain control, post dural puncture headache, nerve damage, paralysis, blood pressure Changes, nausea, vomiting, reactions to medications-both toxic and allergic and post Partum back pain. All questions were answered. Patient expressed understanding and wished to proceed. Sterile technique was used throughout procedure. Epidural site was Dressed with sterile barrier dressing. No paresthesias, signs of intravascular injection Or signs of intrathecal spread were encountered.  Patient was more comfortable after the epidural was dosed. Please see RN's note for documentation of vital signs and FHR which are stable.

## 2018-09-25 NOTE — Anesthesia Preprocedure Evaluation (Signed)
Anesthesia Evaluation  Patient identified by MRN, date of birth, ID band Patient awake    Reviewed: Allergy & Precautions, Patient's Chart, lab work & pertinent test results  Airway Mallampati: II  TM Distance: >3 FB Neck ROM: Full    Dental no notable dental hx. (+) Teeth Intact   Pulmonary asthma ,    Pulmonary exam normal breath sounds clear to auscultation       Cardiovascular negative cardio ROS Normal cardiovascular exam Rhythm:Regular Rate:Normal     Neuro/Psych PSYCHIATRIC DISORDERS ADDnegative neurological ROS     GI/Hepatic Neg liver ROS, GERD  ,  Endo/Other  Obesity  Renal/GU negative Renal ROS  negative genitourinary   Musculoskeletal negative musculoskeletal ROS (+)   Abdominal (+) + obese,   Peds  Hematology  (+) anemia ,   Anesthesia Other Findings   Reproductive/Obstetrics (+) Pregnancy PTL                             Anesthesia Physical Anesthesia Plan  ASA: II  Anesthesia Plan: Epidural   Post-op Pain Management:    Induction:   PONV Risk Score and Plan:   Airway Management Planned: Natural Airway  Additional Equipment:   Intra-op Plan:   Post-operative Plan:   Informed Consent: I have reviewed the patients History and Physical, chart, labs and discussed the procedure including the risks, benefits and alternatives for the proposed anesthesia with the patient or authorized representative who has indicated his/her understanding and acceptance.     Plan Discussed with: Anesthesiologist  Anesthesia Plan Comments:         Anesthesia Quick Evaluation

## 2018-09-25 NOTE — Progress Notes (Signed)
SVD of vigorous female infant.  Placenta delivered spontaneous w/ 3VC.   1st degree & right periurethral lac repaired w/ 3-0 vicryl rapide.  Fundus firm.  EBL approx 100cc .

## 2018-09-25 NOTE — Anesthesia Pain Management Evaluation Note (Signed)
  CRNA Pain Management Visit Note  Patient: Julie Fitzgerald, 22 y.o., female  "Hello [facility] am a [facility]. We have an anesthesia team available at all times to provide care throughout the hospital, including epidural management and anesthesia for C-section. I don't know your plan for the delivery whether it a natural birth, water birth, IV sedation, nitrous supplementation, doula or epidural, but we want to meet your pain goals."   1.Was your pain managed to your expectations on prior hospitalizations?   No prior hospitalizations  2.What is your expectation for pain management during this hospitalization?     Epidural already placed at time of visit  3.How can we help you reach that goal? Support PRN  Record the patient's initial score and the patient's pain goal.   Pain: 10 prior to placement, 0 now   Pain Goal: 4 [facility] wants you to be able to say your pain was always managed very well.  Jennelle HumanLisa B Audri Kozub 09/25/2018

## 2018-09-25 NOTE — Lactation Note (Signed)
This note was copied from a baby's chart. Lactation Consultation Note Mom wants LC to come back at 0130 because they haven't had any sleep and are exhausted. LC gave mom LPI information sheet. Mentioned supplementing baby after BF w/BM or formula to equal amount on the paper according to hours of age. Mom stated OK. Mentioned mom pumping. Mom stated she will after she rest. Encouraged mom to call if needs LC before 0130.  Patient Name: Boy Lennox LaityKatherine Miltenberger ZOXWR'UToday's Date: 09/25/2018     Maternal Data    Feeding Feeding Type: Breast Fed  LATCH Score Latch: Repeated attempts needed to sustain latch, nipple held in mouth throughout feeding, stimulation needed to elicit sucking reflex.  Audible Swallowing: A few with stimulation  Type of Nipple: Flat  Comfort (Breast/Nipple): Soft / non-tender  Hold (Positioning): Assistance needed to correctly position infant at breast and maintain latch.  LATCH Score: 6  Interventions    Lactation Tools Discussed/Used     Consult Status      Ieisha Gao, Diamond NickelLAURA G 09/25/2018, 10:13 PM

## 2018-09-26 LAB — CBC
HCT: 32.1 % — ABNORMAL LOW (ref 36.0–46.0)
Hemoglobin: 10.1 g/dL — ABNORMAL LOW (ref 12.0–15.0)
MCH: 28.1 pg (ref 26.0–34.0)
MCHC: 31.5 g/dL (ref 30.0–36.0)
MCV: 89.4 fL (ref 80.0–100.0)
Platelets: 270 10*3/uL (ref 150–400)
RBC: 3.59 MIL/uL — ABNORMAL LOW (ref 3.87–5.11)
RDW: 14.3 % (ref 11.5–15.5)
WBC: 19.4 10*3/uL — ABNORMAL HIGH (ref 4.0–10.5)
nRBC: 0 % (ref 0.0–0.2)

## 2018-09-26 NOTE — Lactation Note (Signed)
This note was copied from a baby's chart. Lactation Consultation Note Baby 13 hrs old. BF at the breast for a brief feed. Mom stated it became to painful. Baby wasn't staying latched deep enough. Baby is very sleepy.  RN had mom using DEBP. No collection of colostrum noted. Mom knows to pump q3h for 15-20 min. Mom has large breast w/small semi flat nipples. Gave mom shells to wear in am in bra. Hand pump given to pre-pump before latching to evert nipple. Mom stated she had been leaking for several days prior to delivery. Hand expression taught to mom collected 20 ml colostrum. Colostrum expressed easily. Mom excited. Attempted to syring feed baby. Baby wouldn't suckle on LC's gloved finger. Had no interest. Very sleepy, wouldn't wake. Kept tongue humped back. Finger stimulation to relax tongue not helpful.  attempted spoon feed. Baby took maybe 0.5 ml. Took 2 sips and swallows. LPI information sheet given and reviewed. Mom states understanding about supplementing after feeding. Mom done teach back for clarity. Mom understands to increase volume according to hours of age. Mom is fine w/supplementing w/formula if not able to provide amount needed to supplement of colostrum.  Mom understands importance of pumping for stimulation/supplementation. LPI behavior, feeding habits, STS, I&O, milk storage, breast massage, supply and demand discussed. Mom encouraged to feed baby 8-12 times/24 hours and with feeding cues. Mom understands not to feed baby longer than 30 min. And to wake baby if hasn't cued to feed in 3 hrs. Encouraged mom to call for questions or assistance. Answered parents questions as asked.  WH/LC brochure given w/resources, support groups and LC services.  Patient Name: Julie Fitzgerald GMWNU'UToday's Date: 09/26/2018 Reason for consult: Initial assessment;Late-preterm 34-36.6wks;Infant < 6lbs;1st time breastfeeding   Maternal Data Has patient been taught Hand Expression?: Yes Does the  patient have breastfeeding experience prior to this delivery?: No  Feeding Feeding Type: Breast Milk  LATCH Score Latch: Too sleepy or reluctant, no latch achieved, no sucking elicited.  Audible Swallowing: None  Type of Nipple: Flat  Comfort (Breast/Nipple): Soft / non-tender        Interventions Interventions: Breast feeding basics reviewed;Support pillows;Position options;Skin to skin;Expressed milk;Breast massage;Hand express;Shells;Pre-pump if needed;Hand pump;Breast compression;DEBP  Lactation Tools Discussed/Used Tools: Shells;Pump Shell Type: Inverted Breast pump type: Double-Electric Breast Pump;Manual WIC Program: No Pump Review: Setup, frequency, and cleaning;Milk Storage Initiated by:: RN Date initiated:: 09/25/18   Consult Status Consult Status: Follow-up Date: 09/26/18 Follow-up type: In-patient    Bernis Schreur, Diamond NickelLAURA G 09/26/2018, 12:09 AM

## 2018-09-26 NOTE — Lactation Note (Signed)
This note was copied from a baby's chart. Lactation Consultation Note  Patient Name: Boy Lennox LaityKatherine Soman GNFAO'ZToday's Date: 09/26/2018 Reason for consult: Late-preterm 34-36.6wks  During my 1st visit this afternoon, I spoke with parents about likelihood of needing to supplement (with EBM preferably). We also talked about not allowing feedings to last for more than 30 minutes at a time. During this consult, a different hand expression technique was taught to Mom, which was very effective. Almost 15 mLs were obtained through hand expressing. Dad was able to successfully demonstrate how to wash pump parts.   During my 2nd visit, parents had inadvertently spent too much time feeding infant at breast (infant would come unlatched). I assisted in Dad finger-feeding infant EBM, which was successful (infant appeared to benefit from chin support during finger feeding).  Visitors arrived; parents to call me when Mom is  ready to pump.    [name], Odilon Cass [facility] 09/26/2018, 4:17 PM

## 2018-09-26 NOTE — Progress Notes (Signed)
Patient doing well. No complaints. Doing well.  BP 109/67 (BP Location: Left Arm)   Pulse 63   Temp 97.6 F (36.4 C) (Oral)   Resp 18   SpO2 100%   Breastfeeding? Unknown  Results for orders placed or performed during the hospital encounter of 09/24/18 (from the past 24 hour(s))  CBC     Status: Abnormal   Collection Time: 09/26/18  5:04 AM  Result Value Ref Range   WBC 19.4 (H) 4.0 - 10.5 K/uL   RBC 3.59 (L) 3.87 - 5.11 MIL/uL   Hemoglobin 10.1 (L) 12.0 - 15.0 g/dL   HCT 16.132.1 (L) 09.636.0 - 04.546.0 %   MCV 89.4 80.0 - 100.0 fL   MCH 28.1 26.0 - 34.0 pg   MCHC 31.5 30.0 - 36.0 g/dL   RDW 40.914.3 81.111.5 - 91.415.5 %   Platelets 270 150 - 400 K/uL   nRBC 0.0 0.0 - 0.2 %   Abdomen soft and non tender PPD # 1  Doing well Circ done Discharge home tomorrow

## 2018-09-26 NOTE — Lactation Note (Signed)
This note was copied from a baby's chart. Lactation Consultation Note  Patient Name: Julie Fitzgerald ZOXWR'UToday's Date: 09/26/2018  Infant would neither latch nor finger-feed. Bottle feeding was initiated with the Similac slow-flow nipple (yellow). He tolerated bottle-feeding well.   At this time, Mom's yield of EBM is considerably more with hand expression than with pumping. Most recently, we were able to hand express 23 mL in a short time frame.   [name], [name] 09/26/2018, 8:16 PM

## 2018-09-27 MED ORDER — IBUPROFEN 600 MG PO TABS: 600.0000 mg | ORAL_TABLET | Freq: Four times a day (QID) | ORAL | 0 refills | Status: DC

## 2018-09-27 NOTE — Lactation Note (Signed)
This note was copied from a baby's chart. Lactation Consultation Note  Patient Name: Julie Fitzgerald ZOXWR'UToday's Date: 09/27/2018 Reason for consult: Follow-up assessment;1st time breastfeeding;Primapara;Late-preterm 34-36.6wks;Infant weight loss(increased weight - reweight )  3rd LC visit today - mom iced for 15 -20 mins , pumped with small results on the right , drops from left.  LC offered to show dad and grandmother reverse pressure massage of the left breast / nodules less , baby awake after weight check and LC assisted to latch with the laid back position, baby took a few sucks and fell back to sleep.  LC slept baby STS with mom, grandmother and at bedside and plans to watch over mom while she is napping ( power nap ) . Baby due to feed at 1530.  Julie Fitzgerald aware and will help her with the feeding.     Maternal Data Has patient been taught Hand Expression?: Yes(left breast full with nodules/ see LC note for 3rd LC visit today )  Feeding - baby took a few sucks and swallows   Interventions Interventions: Breast feeding basics reviewed  Lactation Tools Discussed/Used Pump Review: Setup, frequency, and cleaning;Milk Storage Initiated by:: LC reviewed  Date initiated:: 09/27/18   Consult Status Consult Status: Follow-up Date: (LC offered to request and LC O/P appt. for next Monday or Tuesday and mom receptive - placed in Epic request ) Follow-up type: Out-patient    Julie Fitzgerald 09/27/2018, 3:04 PM

## 2018-09-27 NOTE — Discharge Summary (Signed)
Obstetric Discharge Summary Reason for Admission: onset of labor Prenatal Procedures: none Intrapartum Procedures: spontaneous vaginal delivery Postpartum Procedures: none Complications-Operative and Postpartum: none Hemoglobin  Date Value Ref Range Status  09/26/2018 10.1 (L) 12.0 - 15.0 g/dL Final   HCT  Date Value Ref Range Status  09/26/2018 32.1 (L) 36.0 - 46.0 % Final    Physical Exam:  General: alert Lochia: appropriate Uterine Fundus: firm Incision: healing well DVT Evaluation: No evidence of DVT seen on physical exam.  Discharge Diagnoses [id number], SVD  Discharge Information: Date: 09/27/2018 Activity: pelvic rest Diet: routine Medications: PNV and Ibuprofen Condition: stable Instructions: refer to practice specific booklet Discharge to: home Follow-up Information    [place] 6 week([facility]).   Contact information: [address] [place] [id number] [phone]           Newborn Data: Live born female  Birth Weight: 5 lb 5.7 oz (2430 g) APGAR: 9, 9  Newborn Delivery   Birth date/time:  09/25/2018 10:57:00 Delivery type:  Vaginal, Spontaneous     Home with mother.  Andrw Mcguirt Milana ObeyM Amour Cutrone 09/27/2018, 9:18 AM

## 2018-09-27 NOTE — Progress Notes (Signed)
MOB was referred for history of depression/anxiety. * Referral screened out by Clinical Social Worker because none of the following criteria appear to apply: ~ History of anxiety/depression during this pregnancy, or of post-partum depression following prior delivery. ~ Diagnosis of anxiety and/or depression within last 3 years; Per OB records MOB was diagnosed as a child.  OR * MOB's symptoms currently being treated with medication and/or therapy. Please contact the Clinical Social Worker if needs arise, by MOB request, or if MOB scores greater than 9/yes to question 10 on [place] Postpartum Depression Screen.  Margean Korell, LCSWA Clinical Social Worker [facility] Cell#: [phone]   

## 2018-09-27 NOTE — Lactation Note (Addendum)
This note was copied from a baby's chart. Lactation Consultation Note  Patient Name: Julie Fitzgerald NATFT'D Date: 09/27/2018 Reason for consult: Follow-up assessment;Infant weight loss;1st time breastfeeding;Primapara;Late-preterm 34-36.6wks;Other (Comment)(breast are fuller )  Baby is 49 hours old/ #2 visit for today  LC entered the room at 1210 p  LC offered to check the baby's diaper / it was dry .  Mom resting in the bed and was trying to pump with the DEBP but stopped due to cramping.  LC offered to assist to latch the baby on the right breast / football/ LC reviewed hand expressing and noted a good flow/ LC had dad watch LC in how to assist to obtain the  Depth and baby fed for 14 mins/ multiple swallows/ increased with breast compressions.  Nipple well rounded when baby released. After feeding reviewed PACE feeding with mom , dad, and grandmother. Dad finished feeding the baby 25 ml of Neosure. He also changed a wet and stool/ thinning mec.  LC reviewed sore nipple and engorgement prevention and tx.  Mom has hand pump , the DEBP kit and her DEBP Spectra for home, ( per mom ).  After feeding on the right breast / left breast full / LC provided ice due to swollen milk ducts and thought  The ice may relax mom so her breast would let down.  LC reviewed basics - importance having the areola compressible like a sandwich so the baby can latch deeply. ( enhance let down and improve soreness)  Reviewed Late preterm feeding plan and potential feeding behaviors. To feed with feeding  Cues and definitely by 3 hours STS until the baby is back to birthweight , gaining steadily and can stay awake for a feeding.  Discussed nutritive vs non - nutritive feeding patterns and to watch for hanging out latched.  Also discussed if its 3 hours and the baby is to sluggish to latch, try and giving the 5-10 ml from  Bottle pace feeding and then latch, if still sluggish to latch feed the entire supplement call  it a feeding, pump both breast for 15 - 20 mins , save milk for next feeding,  LC offered to request and LC O/P appt for next Monday or Tuesday in the afternoon, both mom and dad receptive.  Mother informed of post-discharge support and given phone number to the lactation department, including services for phone call assistance; out-patient appointments; and breastfeeding support group. List of other breastfeeding resources in the community given in the handout. Encouraged mother to call for problems or concerns related to breastfeeding.  After baby fed on the right breast / softened down and cooled down, left breast leaked a samll amount. [place] mother put warm wash cloth and heal warmer on for 15 mins , and LC noted the breast to be fuller with swollen milk ducts. Not engorged .  LC provided 2 ice packs and recommended icing for 15 -20 mins and pumping both breast.  Could start with hand expressing, then hand pump and DEBP if needed.      Maternal Data Has patient been taught Hand Expression?: Yes  Feeding Feeding Type: Breast Milk Nipple Type: Slow - flow  LATCH Score Latch: Grasps breast easily, tongue down, lips flanged, rhythmical sucking.  Audible Swallowing: Spontaneous and intermittent  Type of Nipple: Everted at rest and after stimulation  Comfort (Breast/Nipple): Filling, red/small blisters or bruises, mild/mod discomfort  Hold (Positioning): Assistance needed to correctly position infant at breast and maintain latch.  LATCH Score: 8  Interventions Interventions: Breast feeding basics reviewed;Ice(moms breast ( left full ) and not releasing - provided ICE and  recommended ice for 15 - 20 mins then pump )  Lactation Tools Discussed/Used Pump Review: Setup, frequency, and cleaning;Milk Storage Initiated by:: LC reviewed  Date initiated:: 09/27/18   Consult Status Consult Status: Follow-up Date: ([place] offered to request and LC O/P appt. for next Monday or Tuesday and mom  receptive - placed in Epic request ) Follow-up type: [place] 09/27/2018, 1:35 PM

## 2018-09-27 NOTE — Lactation Note (Signed)
This note was copied from a baby's chart. Lactation Consultation Note  Patient Name: Julie Fitzgerald AOZHY'QToday's Date: 09/27/2018 Reason for consult: Follow-up assessment;Late-preterm 34-36.6wks;Infant weight loss(4% weight loss/ P1/ milk is in / mom aware to call for next feeding )  As Lc entered the room , grandmother holding baby on the couch and baby asleep.  Mom coming out of the bathroom. Just finished her shower and  [facility] to  Check her breast due make sure they weren't engorged. LC assessed breast breast / noted full and warm , few nodules , but not engorged. LC recommended hand expressing then down to comfort and if she needed to pump for 5-7 mins.  Mom mentioned the baby last fed at 9 am and took 25 ml of Neosure. Last wet at 8 am. And last stool at 10 am thinning black.  Has pumped few times in the last 24 hours, and does better with hand expressing , the most 35 ml. LC praised mom for hand expressing.  LC gave mom her phone number to call with feeding cues by 12:30 p .     Maternal Data    Feeding Feeding Type: (baby last fed at 0900 - 25 ml / no latch per mom  ) Nipple Type: Slow - flow  LATCH Score                   Interventions Interventions: Breast feeding basics reviewed  Lactation Tools Discussed/Used     Consult Status Consult Status: Follow-up Date: 09/28/18 Follow-up type: In-patient    Matilde SprangMargaret Ann Skylor Schnapp 09/27/2018, 11:53 AM

## 2018-11-14 ENCOUNTER — Ambulatory Visit: Payer: Self-pay

## 2018-11-14 NOTE — Lactation Note (Signed)
This note was copied from a baby's chart. 11/14/2018  Name: Julie Fitzgerald MRN: [id number] Date of Birth: 09/25/2017 Gestational Age: Gestational Age: [redacted]w[redacted]d Birth Weight: 85.7 oz Weight today:    9 pounds 5.6 ounces (4240 grams) with clean size 77 diaper  41 week old infant presents today for feeding assessment. Mom is concerned she has a lot of pain to the left breast and infant is not latching on that side. Mom is pumping it once a day and supply has decreased on the left breast. Mom reports her nipple on the left breast was sore when infant went home so she did not nurse on it for a week. Once the nipple healed she tried him again on the nipple and she reports pain throughout the feeding. mom describes the left nipple feels like sandpaper is rubbing on her nipple. mom reports infant bites on her left nipple when latching. Mom has no pain on the right breast.   Infant has gained 1895 grams in the last 48 days with an average daily weight gain of 39 grams a day.   Mom reports infant is feeding frequently around the clock. He sleeps more during the day and less at night. Enc mom to wake him more frequently during the day to feed.   Mom is finding it difficult to make time to pump the left breast. She is in school, infant feeds frequently, and they are moving this weekend. Mom is using the Ha[place]aa some on the left breast. She gets less with the [facility] than with the pump. Mom does not feel like she can get any more pumpings in, enc mom to use the [facility] with each feeding to see if it will help increase the supply on the left. Discussed with mom that nothing will increase her supply on the left if the breast is not emptied regularly.   We latched him to the right breast briefly to get him started. We then latched him to the left breast. Nipple is smaller that the right breast, tissue is compressible. Nipple tissue intact. Infant latched, mom in a lot of pain. Nipple tissue intact. # 24 NS placed and  infant relatched, mom reports pain was lessened. Enc mom to feed on the left breast as much as she is able to tolerated. It. Infant fed prior to appt today. Infant pulled on and off the breast and fed in short bursts. Mom reports infant feeds for long  Periods of time during the day and at night.   Mom reports pain with pumping and reports when using a manual pump she is more comfortable. Mom uses # 24 flanges with her pump.   Ranitidine for spitting up large amounts. Mom reports infant is gassy and uncomfortable at night. Reviewed foods that can effect gassiness in infant.   Enc mom to have infant evaluated by Oral Specialist if she and dad would like.   Reviewed applying yeast ointment to left nipple to see if that helps. Nipple does not appear to have yeast infection. Mom is not using the nipple regularly and it is not feeling any better. Yeast handout given and reviewed.   Infant to follow up with Cherylann Ratel at [facility] on 1/29. Mom aware of Bf Supports. Infant to follow up with Lactation as needed at [facility] request.   Mom reports all questions have been answered. Mom to call with questions/concerns as needed.     General Information: Mother's reason for visit: Feeding assessment, nipple pain  Consult: Initial Lactation consultant: Noralee Stain RN,IBCLC Breastfeeding experience: Latching to the right breast only, feeds very often   Maternal medications: Pre-natal vitamin, Other(Mother's Milk Tea)  Breastfeeding History: Frequency of breast feeding: every 1-2 hours Duration of feeding: 20-30 minutes  Supplementation: Supplement method: bottle(Tommie Tippee)         Breast milk volume: 3-4 ounces Breast milk frequency: every few days   Pump type: Spectra Pump frequency: 1 x a day Pump volume: 1 ounce on the left breast and 3-4 ounces on the right breast  Infant Output Assessment: Voids per 24 hours: 10 Urine color: Clear yellow Stools per 24 hours:  8 Stool color: Yellow  Breast Assessment: Breast: Soft, Compressible Nipple: Erect, Other(right nipple larger than the left nipple, left nipple very painful) Pain level: 7(left nipple with feeding and pumping) Pain interventions: Bra, Coconut oil  Feeding Assessment: Infant oral assessment: Variance Infant oral assessment comment: Infant noted to have thick labial frenulum that inserts half way down the gum ridge. Upper lip flanges well. Infant with sucking blister to the center of the upper lip. Infant with short thin posterior lingual frenulum. Infant with good tongue extension and lateralization. Infant with decreased mid tongue elevation. Infant with high bubble palate. Mom with pain to the left nipple with feeding. Right and left nipple compressed post feeding. Infant tongue thrusting on the breast and on the finger. Infant tires easily with feedings and feeds on and off for most of the feedings per mom. Infant does not tolerate being laid on his back after feedings. Discussed with mom how tongue and lip restrictions can effect milk supply and BF. Enc mom to pump to protect milk supply. Mom given web site information and local Medicaid Providers ([place]).  Positioning: Cross cradle(left breast, off and on for 30  minutes) Latch: 2 - Grasps breast easily, tongue down, lips flanged, rhythmical sucking. Audible swallowing: 2 - Spontaneous and intermittent Type of nipple: 2 - Everted at rest and after stimulation Comfort: 2 - Soft/non-tender Hold: 2 - No assistance needed to correctly position infant at breast LATCH score: 10 Latch assessment: Deep Lips flanged: Yes Suck assessment: Displays both   Pre-feed weight: 4240 grams/4272 grams Post feed weight: 4256 grams/4308 grams Amount transferred: 16 ml + 36 ml Amount supplemented: 0  Additional Feeding Assessment: Infant oral assessment: Variance Infant oral assessment comment: see above Positioning: Cross cradle(right  breast) Latch: 2 - Grasps breast easily, tongue down, lips flanged, rhythmical sucking. Audible swallowing: 1 - A few with stimulation Type of nipple: 2 - Everted at rest and after stimulation Comfort: 1 - Filling, red/small blisters or bruises, mild/mod discomfort Hold: 1 - Assistance needed to correctly position infant at breast and maintain latch LATCH score: 7 Latch assessment: Deep Lips flanged: Yes Suck assessment: Displays both Tools: Nipple shield 24 mm Pre-feed weight: 4256 grams Post feed weight: 4272 grams Amount transferred: 16 ml Amount supplemented: 0  Totals: Total amount transferred: 68 ml Total supplement given: 0 Total amount pumped post feed: did not pump   Plan:  1. Offer infant the breast with feeding cues 2. Try feeding on the left breast when you are able 3. Use the # 24 Nipple Shield with feeding as needed for pain on the left breast 4. Try pumping 2-3 x a day after breast feeding or if giving a bottle to increase supply on the left breast and to protect milk supply If not able to use your electric pump, try using the  Ha[place]aa with as many feedings a day as you can 5. Offer infant a bottle when you want to give you a break 6. Keep infant upright for about 20 minutes after feeding 7. Keep up the good work 8. Thank you for allowing me to assist you today 9. Please call with any questions/concerns as needed 10. Follow up with Lactation as needed or 1-5 days post tongue and lip releases   [facility] Shenequa Howse RN, IBCLC                                                      Silas FloodSharon S Corwyn Vora 11/14/2018, 11:50 AM

## 2019-02-06 ENCOUNTER — Telehealth (HOSPITAL_COMMUNITY): Payer: Self-pay | Admitting: Lactation Services

## 2019-02-06 NOTE — Telephone Encounter (Addendum)
Patient called. She is 4 months postpartum and is, essentially, only lactating from her R breast. A couple of hours ago, she noticed a small fluid-filled blister ("the size of a pin head") on the tip of her R nipple (likely from the way her son, Loanne Drilling, has begun unlatching himself). The blister makes it painful to pump/uncomfortable to feed. Mom was given the following tips:  1. Epsom salt soaks (2 tsps of Epsom salt in 1 cup of hot water). Let water cool before soaking nipple a few times/day for a few minutes. 2. See if pumping with a larger-size flange will give nipple more room to expand. 3. Patient inquired if lancing it with a sterile needle was OK. I answered that was fine, provided that blister was cleaned beforehand with alcohol and afterwards with warm soap and water. Patient was made aware that polysporin is an approved OTC ointment for nipple/breast care, but she may not even need to use it.    Glenetta Hew, RN, IBCLC

## 2021-05-24 ENCOUNTER — Inpatient Hospital Stay (HOSPITAL_COMMUNITY): Payer: BC Managed Care – PPO

## 2021-05-24 ENCOUNTER — Other Ambulatory Visit: Payer: Self-pay

## 2021-05-24 ENCOUNTER — Inpatient Hospital Stay (HOSPITAL_COMMUNITY)
Admission: AD | Admit: 2021-05-24 | Discharge: 2021-05-24 | Disposition: A | Discharge status: Home / Self Care | Payer: BC Managed Care – PPO | Attending: Obstetrics & Gynecology | Admitting: Obstetrics & Gynecology

## 2021-05-24 ENCOUNTER — Encounter (HOSPITAL_COMMUNITY): Payer: Self-pay | Admitting: Obstetrics & Gynecology

## 2021-05-24 DIAGNOSIS — J45909 Unspecified asthma, uncomplicated: Secondary | ICD-10-CM | POA: Insufficient documentation

## 2021-05-24 DIAGNOSIS — Z79899 Other long term (current) drug therapy: Secondary | ICD-10-CM | POA: Insufficient documentation

## 2021-05-24 DIAGNOSIS — U071 COVID-19: Secondary | ICD-10-CM | POA: Diagnosis not present

## 2021-05-24 DIAGNOSIS — Z3A12 12 weeks gestation of pregnancy: Secondary | ICD-10-CM | POA: Diagnosis not present

## 2021-05-24 DIAGNOSIS — O98511 Other viral diseases complicating pregnancy, first trimester: Secondary | ICD-10-CM | POA: Insufficient documentation

## 2021-05-24 DIAGNOSIS — Z880 Allergy status to penicillin: Secondary | ICD-10-CM | POA: Diagnosis not present

## 2021-05-24 DIAGNOSIS — O99511 Diseases of the respiratory system complicating pregnancy, first trimester: Secondary | ICD-10-CM | POA: Diagnosis not present

## 2021-05-24 LAB — COMPREHENSIVE METABOLIC PANEL
ALT: 16 U/L (ref 0–44)
AST: 22 U/L (ref 15–41)
Albumin: 4.1 g/dL (ref 3.5–5.0)
Alkaline Phosphatase: 47 U/L (ref 38–126)
Anion gap: 10 (ref 5–15)
BUN: 5 mg/dL — ABNORMAL LOW (ref 6–20)
CO2: 22 mmol/L (ref 22–32)
Calcium: 9.4 mg/dL (ref 8.9–10.3)
Chloride: 101 mmol/L (ref 98–111)
Creatinine, Ser: 0.64 mg/dL (ref 0.44–1.00)
GFR, Estimated: >60 mL/min (ref >60)
Glucose, Bld: 100 mg/dL — ABNORMAL HIGH (ref 70–99)
Potassium: 3.4 mmol/L — ABNORMAL LOW (ref 3.5–5.1)
Sodium: 133 mmol/L — ABNORMAL LOW (ref 135–145)
Total Bilirubin: 0.3 mg/dL (ref 0.3–1.2)
Total Protein: 7.5 g/dL (ref 6.5–8.1)

## 2021-05-24 LAB — URINALYSIS, ROUTINE W REFLEX MICROSCOPIC
Bilirubin Urine: NEGATIVE
Glucose, UA: NEGATIVE mg/dL
Hgb urine dipstick: NEGATIVE
Ketones, ur: 20 mg/dL — AB
Leukocytes,Ua: NEGATIVE
Nitrite: NEGATIVE
Protein, ur: NEGATIVE mg/dL
Specific Gravity, Urine: 1.023 (ref 1.005–1.030)
pH: 5 (ref 5.0–8.0)

## 2021-05-24 LAB — RESP PANEL BY RT-PCR (FLU A&B, COVID) ARPGX2
Influenza A by PCR: NEGATIVE
Influenza B by PCR: NEGATIVE
SARS Coronavirus 2 by RT PCR: POSITIVE — AB

## 2021-05-24 LAB — CBC
HCT: 37.1 % (ref 36.0–46.0)
Hemoglobin: 12.3 g/dL (ref 12.0–15.0)
MCH: 30.6 pg (ref 26.0–34.0)
MCHC: 33.2 g/dL (ref 30.0–36.0)
MCV: 92.3 fL (ref 80.0–100.0)
Platelets: 193 10*3/uL (ref 150–400)
RBC: 4.02 MIL/uL (ref 3.87–5.11)
RDW: 13.4 % (ref 11.5–15.5)
WBC: 6 10*3/uL (ref 4.0–10.5)
nRBC: 0 % (ref 0.0–0.2)

## 2021-05-24 MED ORDER — LACTATED RINGERS IV BOLUS
1000.0000 mL | Freq: Once | INTRAVENOUS | Status: AC
  Administered 2021-05-24: 1000 mL via INTRAVENOUS

## 2021-05-24 MED ORDER — ONDANSETRON 4 MG PO TBDP: 8.0000 mg | ORAL_TABLET | Freq: Once | ORAL | Status: DC

## 2021-05-24 MED ORDER — ACETAMINOPHEN 500 MG PO TABS
1000.0000 mg | ORAL_TABLET | Freq: Once | ORAL | Status: AC
  Administered 2021-05-24: 1000 mg via ORAL
  Filled 2021-05-24: qty 2

## 2021-05-24 MED ORDER — SODIUM CHLORIDE 0.9 % IV SOLN
8.0000 mg | Freq: Once | INTRAVENOUS | Status: AC
  Administered 2021-05-24: 8 mg via INTRAVENOUS
  Filled 2021-05-24: qty 4

## 2021-05-24 MED ORDER — DIPHENHYDRAMINE HCL 50 MG/ML IJ SOLN
25.0000 mg | Freq: Once | INTRAMUSCULAR | Status: AC
  Administered 2021-05-24: 25 mg via INTRAVENOUS
  Filled 2021-05-24: qty 1

## 2021-05-24 MED ORDER — DEXAMETHASONE SODIUM PHOSPHATE 10 MG/ML IJ SOLN
10.0000 mg | INTRAMUSCULAR | Status: AC
  Administered 2021-05-24: 10 mg via INTRAVENOUS
  Filled 2021-05-24: qty 1

## 2021-05-24 MED ORDER — M.V.I. ADULT IV INJ
Freq: Once | INTRAVENOUS | Status: AC
  Filled 2021-05-24: qty 1000

## 2021-05-24 MED ORDER — ONDANSETRON 8 MG PO TBDP: 8.0000 mg | ORAL_TABLET | Freq: Three times a day (TID) | ORAL | 0 refills | Status: DC | PRN

## 2021-05-24 NOTE — MAU Note (Signed)
Julie Fitzgerald is a 25 y.o. at [redacted]w[redacted]d here in MAU reporting: woke up this AM with a headache. Has been feeling hot and sweaty. Has had a rash since last week, it is on her thighs and forearms and is very itchy. Also has had some emesis.   Onset of complaint: today  Pain score: 5/10  Vitals:   05/24/21 1229  BP: 118/60  Pulse: (!) 106  Resp: 16  Temp: 99 F (37.2 C)  SpO2: 99%     Lab orders placed from triage: UA

## 2021-05-24 NOTE — MAU Provider Note (Signed)
History     CSN: [id number]  Arrival date and time: 05/24/21 1158   Event Date/Time   First Provider Initiated Contact with Patient 05/24/21 1432      Chief Complaint  Patient presents with   Rash   Headache   Back Pain   Julie Fitzgerald is a 25 y.o. year old [id number] female at 103w1d weeks gestation who presents to MAU reporting she woke up with a H/A @ 0500 today. She took Tylenol @ 0645. She reports that when she stood up to go the BR, she got a flushed feeling. She sat down and started feeling nauseated. Her spouse brought her an apple and toast; which helped her "feel a little better." She called her OB office ([facility]) and was told to go to the office for a work in visit. She reports her blood sugar in the office was 68. She states the doctor that saw her "was concerned that her blood sugar and blood pressure wouldn't stay up." So the doctor who saw her in the office, recommended she come to MAU for IVF, because she "might be dehydrated." She also reports a "very itchy rash" on her face, neck, forearms, and thighs. The rash is not present today, but she has pictures on her phone. She is a Licensed conveyancer, but states this rash "is not like poison ivy or poison oak." Her mother is present and contributing to the history taking.    OB History     Gravida  2   Para  1   Term      Preterm  1   AB      Living  1      SAB      IAB      Ectopic      Multiple  0   Live Births  1           Past Medical History:  Diagnosis Date   ADD (attention deficit disorder)    Asthma    Environmental allergies    Iron deficiency     Past Surgical History:  Procedure Laterality Date   NO PAST SURGERIES      History reviewed. No pertinent family history.  Social History   Tobacco Use   Smoking status: Never   Smokeless tobacco: Never  Substance Use Topics   Alcohol use: No   Drug use: No    Allergies:  Allergies  Allergen Reactions    Amoxicillin Rash    Has patient had a PCN reaction causing immediate rash, facial/tongue/throat swelling, SOB or lightheadedness with hypotension: Yes Has patient had a PCN reaction causing severe rash involving mucus membranes or skin necrosis: Yes Has patient had a PCN reaction that required hospitalization No Has patient had a PCN reaction occurring within the last 10 years: No If all of the above answers are "NO", then may proceed with Cephalosporin use.     Medications Prior to Admission  Medication Sig Dispense Refill Last Dose   citalopram (CELEXA) 10 MG tablet Take 10 mg by mouth daily.   Past Week   Prenatal Vit-Fe Fumarate-FA (MULTIVITAMIN-PRENATAL) 27-0.8 MG TABS tablet Take 1 tablet by mouth daily at 12 noon.   05/23/2021   albuterol (PROVENTIL HFA;VENTOLIN HFA) 108 (90 Base) MCG/ACT inhaler Inhale 2 puffs into the lungs every 3 (three) hours as needed for wheezing or shortness of breath.      ibuprofen (ADVIL,MOTRIN) 600 MG tablet Take 1 tablet (600 mg total)  by mouth every 6 (six) hours. 30 tablet 0     Review of Systems  Constitutional:  Positive for fatigue.  HENT: Negative.    Eyes: Negative.   Respiratory: Negative.    Cardiovascular: Negative.   Gastrointestinal: Negative.   Endocrine: Negative.   Genitourinary: Negative.   Musculoskeletal: Negative.   Skin:  Positive for rash ("very itchy").  Allergic/Immunologic: Negative.   Neurological:  Positive for headaches.  Hematological: Negative.   Psychiatric/Behavioral: Negative.    Physical Exam   Patient Vitals for the past 24 hrs:  BP Temp Temp src Pulse Resp SpO2  05/24/21 1545 -- (!) 100.4 F (38 C) Oral -- -- --  05/24/21 1439 -- -- -- (!) 138 -- --  05/24/21 1430 -- -- -- (!) 115 -- --  05/24/21 1400 -- (!) 102 F (38.9 C) Oral -- -- --  05/24/21 1305 (!) 111/52 -- -- 97 -- --  05/24/21 1229 118/60 99 F (37.2 C) Oral (!) 106 16 99 %     Physical Exam Vitals and nursing note reviewed.   Constitutional:      Appearance: Normal appearance. She is normal weight.  HENT:     Head: Normocephalic and atraumatic.  Eyes:     Extraocular Movements: Extraocular movements intact.     Pupils: Pupils are equal, round, and reactive to light.  Cardiovascular:     Rate and Rhythm: Tachycardia present.  Pulmonary:     Effort: Pulmonary effort is normal.  Abdominal:     General: Abdomen is flat.     Palpations: Abdomen is soft.  Genitourinary:    Comments: Not indicated Musculoskeletal:        General: Normal range of motion.     Cervical back: Normal range of motion and neck supple.  Skin:    General: Skin is warm and dry.     Comments: Some excoriation on forearms, but no rash seen as what was shown on her phone  Neurological:     Mental Status: She is alert and oriented to person, place, and time.  Psychiatric:        Mood and Affect: Mood normal.        Behavior: Behavior normal.        Thought Content: Thought content normal.        Judgment: Judgment normal.   *Reassessment @ 1545: H/A resolved, still feeling hot - temp retaken (as noted above)  MAU Course  Procedures  MDM Received headache cocktail (LR 1000 mL @ 999 mL/hr with Benadryl 25 mg IVP, Zofran 8 mg IVPB, Decadron 10 mg IVP) -- reports relief @ 1545.  *Consult with Dr. Crissie Reese @ [phone] - notified of patient's complaints, assessments, lab results, tx plan CT of head and add bag of MVI for potassium of 3.4 - agrees with plan  Results for orders placed or performed during the hospital encounter of 05/24/21 (from the past 24 hour(s))  Urinalysis, Routine w reflex microscopic OB Clean Catch     Status: Abnormal   Collection Time: 05/24/21 12:17 PM  Result Value Ref Range   Color, Urine YELLOW YELLOW   APPearance HAZY (A) CLEAR   Specific Gravity, Urine 1.023 1.005 - 1.030   pH 5.0 5.0 - 8.0   Glucose, UA NEGATIVE NEGATIVE mg/dL   Hgb urine dipstick NEGATIVE NEGATIVE   Bilirubin Urine NEGATIVE NEGATIVE    Ketones, ur 20 (A) NEGATIVE mg/dL   Protein, ur NEGATIVE NEGATIVE mg/dL   Nitrite NEGATIVE NEGATIVE  Leukocytes,Ua NEGATIVE NEGATIVE  Resp Panel by RT-PCR (Flu A&B, Covid) Nasopharyngeal Swab     Status: Abnormal   Collection Time: 05/24/21  2:04 PM   Specimen: Nasopharyngeal Swab; Nasopharyngeal(NP) swabs in vial transport medium  Result Value Ref Range   SARS Coronavirus 2 by RT PCR POSITIVE (A) NEGATIVE   Influenza A by PCR NEGATIVE NEGATIVE   Influenza B by PCR NEGATIVE NEGATIVE  CBC     Status: None   Collection Time: 05/24/21  2:10 PM  Result Value Ref Range   WBC 6.0 4.0 - 10.5 K/uL   RBC 4.02 3.87 - 5.11 MIL/uL   Hemoglobin 12.3 12.0 - 15.0 g/dL   HCT 16.1 09.6 - 04.5 %   MCV 92.3 80.0 - 100.0 fL   MCH 30.6 26.0 - 34.0 pg   MCHC 33.2 30.0 - 36.0 g/dL   RDW 40.9 81.1 - 91.4 %   Platelets 193 150 - 400 K/uL   nRBC 0.0 0.0 - 0.2 %  Comprehensive metabolic panel     Status: Abnormal   Collection Time: 05/24/21  2:10 PM  Result Value Ref Range   Sodium 133 (L) 135 - 145 mmol/L   Potassium 3.4 (L) 3.5 - 5.1 mmol/L   Chloride 101 98 - 111 mmol/L   CO2 22 22 - 32 mmol/L   Glucose, Bld 100 (H) 70 - 99 mg/dL   BUN 5 (L) 6 - 20 mg/dL   Creatinine, Ser 7.82 0.44 - 1.00 mg/dL   Calcium 9.4 8.9 - 95.6 mg/dL   Total Protein 7.5 6.5 - 8.1 g/dL   Albumin 4.1 3.5 - 5.0 g/dL   AST 22 15 - 41 U/L   ALT 16 0 - 44 U/L   Alkaline Phosphatase 47 38 - 126 U/L   Total Bilirubin 0.3 0.3 - 1.2 mg/dL   GFR, Estimated >21 >30 mL/min   Anion gap 10 5 - 15    CT Head Wo Contrast  Result Date: 05/24/2021 CLINICAL DATA:  Headache, new or worsening, pregnant EXAM: CT HEAD WITHOUT CONTRAST TECHNIQUE: Contiguous axial images were obtained from the base of the skull through the vertex without intravenous contrast. COMPARISON:  None. FINDINGS: Brain: No acute intracranial abnormality. Specifically, no hemorrhage, hydrocephalus, mass lesion, acute infarction, or significant intracranial injury.  Vascular: No hyperdense vessel or unexpected calcification. Skull: No acute calvarial abnormality. Sinuses/Orbits: No acute findings Other: None IMPRESSION: Normal study. Electronically Signed   By: Charlett Nose M.D.   On: 05/24/2021 16:28     Assessment and Plan  COVID-19 affecting pregnancy in first trimester  - Offered Rx for Paxlovid -- patient declined - Information provided on Pregnancy and COVID, how to manage COVID sx's, quarantine vs isolation, what to do when you are sick  [redacted] weeks gestation of pregnancy  - Discussed sx's to return to MAU for  - Discharge patient - Contact P4W office to notify them of (+) COVID results and the need to reschedule - Patient verbalized an understanding of the plan of care and agrees.    Raelyn Mora, CNM 05/24/2021, 2:32 PM

## 2021-05-24 NOTE — MAU Note (Signed)
Lab called to report +COVID test. Negative for Flu A and B.

## 2021-05-24 NOTE — Discharge Instructions (Signed)

## 2021-07-25 ENCOUNTER — Other Ambulatory Visit: Payer: Self-pay

## 2021-07-25 ENCOUNTER — Inpatient Hospital Stay (HOSPITAL_COMMUNITY)
Admission: AD | Admit: 2021-07-25 | Discharge: 2021-07-25 | Disposition: A | Discharge status: Home / Self Care | Payer: BC Managed Care – PPO | Attending: Obstetrics & Gynecology | Admitting: Obstetrics & Gynecology

## 2021-07-25 ENCOUNTER — Encounter (HOSPITAL_COMMUNITY): Payer: Self-pay | Admitting: Obstetrics & Gynecology

## 2021-07-25 DIAGNOSIS — Z880 Allergy status to penicillin: Secondary | ICD-10-CM | POA: Diagnosis not present

## 2021-07-25 DIAGNOSIS — Z20822 Contact with and (suspected) exposure to covid-19: Secondary | ICD-10-CM | POA: Insufficient documentation

## 2021-07-25 DIAGNOSIS — O26892 Other specified pregnancy related conditions, second trimester: Secondary | ICD-10-CM | POA: Insufficient documentation

## 2021-07-25 DIAGNOSIS — Z3A21 21 weeks gestation of pregnancy: Secondary | ICD-10-CM | POA: Diagnosis not present

## 2021-07-25 DIAGNOSIS — R519 Headache, unspecified: Secondary | ICD-10-CM | POA: Diagnosis not present

## 2021-07-25 DIAGNOSIS — R053 Chronic cough: Secondary | ICD-10-CM | POA: Insufficient documentation

## 2021-07-25 DIAGNOSIS — Z8616 Personal history of COVID-19: Secondary | ICD-10-CM | POA: Diagnosis not present

## 2021-07-25 LAB — CBC WITH DIFFERENTIAL/PLATELET
Abs Immature Granulocytes: 0.02 10*3/uL (ref 0.00–0.07)
Basophils Absolute: 0 10*3/uL (ref 0.0–0.1)
Basophils Relative: 0 %
Eosinophils Absolute: 0 10*3/uL (ref 0.0–0.5)
Eosinophils Relative: 0 %
HCT: 34.9 % — ABNORMAL LOW (ref 36.0–46.0)
Hemoglobin: 11.5 g/dL — ABNORMAL LOW (ref 12.0–15.0)
Immature Granulocytes: 0 %
Lymphocytes Relative: 13 %
Lymphs Abs: 1.2 10*3/uL (ref 0.7–4.0)
MCH: 30.8 pg (ref 26.0–34.0)
MCHC: 33 g/dL (ref 30.0–36.0)
MCV: 93.6 fL (ref 80.0–100.0)
Monocytes Absolute: 0.5 10*3/uL (ref 0.1–1.0)
Monocytes Relative: 6 %
Neutro Abs: 7.4 10*3/uL (ref 1.7–7.7)
Neutrophils Relative %: 81 %
Platelets: 222 10*3/uL (ref 150–400)
RBC: 3.73 MIL/uL — ABNORMAL LOW (ref 3.87–5.11)
RDW: 13 % (ref 11.5–15.5)
WBC: 9.1 10*3/uL (ref 4.0–10.5)
nRBC: 0 % (ref 0.0–0.2)

## 2021-07-25 LAB — URINALYSIS, ROUTINE W REFLEX MICROSCOPIC
Bilirubin Urine: NEGATIVE
Glucose, UA: NEGATIVE mg/dL
Hgb urine dipstick: NEGATIVE
Ketones, ur: NEGATIVE mg/dL
Leukocytes,Ua: NEGATIVE
Nitrite: NEGATIVE
Protein, ur: NEGATIVE mg/dL
Specific Gravity, Urine: 1.009 (ref 1.005–1.030)
pH: 7 (ref 5.0–8.0)

## 2021-07-25 LAB — COMPREHENSIVE METABOLIC PANEL
ALT: 21 U/L (ref 0–44)
AST: 19 U/L (ref 15–41)
Albumin: 3.2 g/dL — ABNORMAL LOW (ref 3.5–5.0)
Alkaline Phosphatase: 61 U/L (ref 38–126)
Anion gap: 8 (ref 5–15)
BUN: 7 mg/dL (ref 6–20)
CO2: 23 mmol/L (ref 22–32)
Calcium: 9.1 mg/dL (ref 8.9–10.3)
Chloride: 104 mmol/L (ref 98–111)
Creatinine, Ser: 0.57 mg/dL (ref 0.44–1.00)
GFR, Estimated: >60 mL/min (ref >60)
Glucose, Bld: 91 mg/dL (ref 70–99)
Potassium: 3.9 mmol/L (ref 3.5–5.1)
Sodium: 135 mmol/L (ref 135–145)
Total Bilirubin: 0.3 mg/dL (ref 0.3–1.2)
Total Protein: 6.6 g/dL (ref 6.5–8.1)

## 2021-07-25 LAB — PROTEIN / CREATININE RATIO, URINE
Creatinine, Urine: 56.73 mg/dL
Total Protein, Urine: <6 mg/dL

## 2021-07-25 LAB — RESP PANEL BY RT-PCR (FLU A&B, COVID) ARPGX2
Influenza A by PCR: NEGATIVE
Influenza B by PCR: NEGATIVE
SARS Coronavirus 2 by RT PCR: NEGATIVE

## 2021-07-25 MED ORDER — LACTATED RINGERS IV BOLUS
1000.0000 mL | Freq: Once | INTRAVENOUS | Status: AC
  Administered 2021-07-25: 1000 mL via INTRAVENOUS

## 2021-07-25 MED ORDER — ACETAMINOPHEN 500 MG PO TABS
1000.0000 mg | ORAL_TABLET | Freq: Once | ORAL | Status: AC
  Administered 2021-07-25: 1000 mg via ORAL
  Filled 2021-07-25: qty 2

## 2021-07-25 MED ORDER — DIPHENHYDRAMINE HCL 50 MG/ML IJ SOLN
12.5000 mg | Freq: Once | INTRAMUSCULAR | Status: AC
  Administered 2021-07-25: 12.5 mg via INTRAVENOUS
  Filled 2021-07-25: qty 1

## 2021-07-25 MED ORDER — METOCLOPRAMIDE HCL 10 MG PO TABS: 10.0000 mg | ORAL_TABLET | Freq: Three times a day (TID) | ORAL | 0 refills | Status: DC | PRN

## 2021-07-25 MED ORDER — METOCLOPRAMIDE HCL 5 MG/ML IJ SOLN
10.0000 mg | Freq: Once | INTRAMUSCULAR | Status: AC
  Administered 2021-07-25: 10 mg via INTRAVENOUS
  Filled 2021-07-25: qty 2

## 2021-07-25 NOTE — Discharge Instructions (Signed)
Tylenol 1000mg  Reglan 10mg  Benadryl 12.5-25mg  Caffeine

## 2021-07-25 NOTE — MAU Provider Note (Signed)
History     CSN: [id number]  Arrival date and time: 07/25/21 0944   Event Date/Time   First Provider Initiated Contact with Patient 07/25/21 1022      Chief Complaint  Patient presents with   Headache   Julie Fitzgerald is a 25 y.o. G2P0101 at [redacted]w[redacted]d who presents to MAU for headache that has persisted since Friday. Patient states she had a mild HA on Thursday, it went away, and then returned significantly on Friday and has not gone away since. Patient states she has done hot showers, several doses of Tylenol, nasal spray, ice packs, caffeine in the form of coffee, Nette pot. Patient states none of these remedies helped. Patient states she last took Tylenol yesterday at 330PM and took 2 extra strength Tylenol. Patient states she does not get frequent headaches and last had a headache when she had COVID two months ago. Patient denies any COVID exposures since that time, but states she does not wear masks when she is outside and is not vaccinated. Patient states she rates HA as 4/10 when sitting, but 7/10 when coughing or changing positions, when she experiences a throbbing sensation. Patient states the pain is left-sided.  Patient also states that she is being treated for a sinus infection by her PCP after she had a sore throat and cough. Patient was tested for COVID and flu on Thursday 07/22/2021 and was negative. Patient states she has had a cough for a week with mucus and her son has also had a cough for 3 weeks. Patient states coughing is minimal at this time and her sore throat is done. Patient started taking the Zpak on Thursday and is on Day 4 of the medication, but she does not feel like it is helping.  Pt denies VB, LOF, ctx, decreased FM, vaginal discharge/odor/itching. Pt denies N/V, abdominal pain, constipation, diarrhea, or urinary problems. Pt denies fever, chills, fatigue, sweating or changes in appetite. Pt denies SOB or chest pain. Pt denies dizziness, light-headedness,  weakness.  Problems this pregnancy include: none. Allergies? AMOX Current medications/supplements? PNVs Prenatal care provider? [facility], next appt 08/16/2020   OB History     Gravida  2   Para  1   Term      Preterm  1   AB      Living  1      SAB      IAB      Ectopic      Multiple  0   Live Births  1           Past Medical History:  Diagnosis Date   ADD (attention deficit disorder)    Asthma    Environmental allergies    Iron deficiency     Past Surgical History:  Procedure Laterality Date   NO PAST SURGERIES      History reviewed. No pertinent family history.  Social History   Tobacco Use   Smoking status: Never   Smokeless tobacco: Never  Vaping Use   Vaping Use: Never used  Substance Use Topics   Alcohol use: No   Drug use: No    Allergies:  Allergies  Allergen Reactions   Amoxicillin Rash    Has patient had a PCN reaction causing immediate rash, facial/tongue/throat swelling, SOB or lightheadedness with hypotension: Yes Has patient had a PCN reaction causing severe rash involving mucus membranes or skin necrosis: Yes Has patient had a PCN reaction that required hospitalization No Has patient  had a PCN reaction occurring within the last 10 years: No If all of the above answers are "NO", then may proceed with Cephalosporin use.     Medications Prior to Admission  Medication Sig Dispense Refill Last Dose   albuterol (PROVENTIL HFA;VENTOLIN HFA) 108 (90 Base) MCG/ACT inhaler Inhale 2 puffs into the lungs every 3 (three) hours as needed for wheezing or shortness of breath.      citalopram (CELEXA) 10 MG tablet Take 10 mg by mouth daily.      ondansetron (ZOFRAN-ODT) 8 MG disintegrating tablet Take 1 tablet (8 mg total) by mouth every 8 (eight) hours as needed for nausea or vomiting. 30 tablet 0    Prenatal Vit-Fe Fumarate-FA (MULTIVITAMIN-PRENATAL) 27-0.8 MG TABS tablet Take 1 tablet by mouth daily at 12 noon.        Review of Systems  Constitutional:  Negative for chills, diaphoresis, fatigue and fever.  Eyes:  Negative for visual disturbance.  Respiratory:  Positive for cough. Negative for shortness of breath.   Cardiovascular:  Negative for chest pain.  Gastrointestinal:  Negative for abdominal pain, constipation, diarrhea, nausea and vomiting.  Genitourinary:  Negative for dysuria, flank pain, frequency, pelvic pain, urgency, vaginal bleeding and vaginal discharge.  Neurological:  Positive for headaches. Negative for dizziness, weakness and light-headedness.   Physical Exam   Blood pressure 120/64, pulse 91, temperature 98.2 F (36.8 C), temperature source Oral, resp. rate 15, SpO2 100 %, unknown if currently breastfeeding.  Patient Vitals for the past 24 hrs:  BP Temp Temp src Pulse Resp SpO2  07/25/21 1014 120/64 -- -- 91 -- --  07/25/21 0958 121/70 98.2 F (36.8 C) Oral 98 15 100 %   Physical Exam Vitals and nursing note reviewed.  Constitutional:      General: She is not in acute distress.    Appearance: Normal appearance. She is not ill-appearing, toxic-appearing or diaphoretic.  HENT:     Head: Normocephalic and atraumatic.  Pulmonary:     Effort: Pulmonary effort is normal.  Neurological:     General: No focal deficit present.     Mental Status: She is alert and oriented to person, place, and time.  Psychiatric:        Mood and Affect: Mood normal.        Behavior: Behavior normal.        Thought Content: Thought content normal.        Judgment: Judgment normal.   Results for orders placed or performed during the hospital encounter of 07/25/21 (from the past 24 hour(s))  Urinalysis, Routine w reflex microscopic Urine, Clean Catch     Status: Abnormal   Collection Time: 07/25/21 10:01 AM  Result Value Ref Range   Color, Urine YELLOW YELLOW   APPearance HAZY (A) CLEAR   Specific Gravity, Urine 1.009 1.005 - 1.030   pH 7.0 5.0 - 8.0   Glucose, UA NEGATIVE NEGATIVE mg/dL    Hgb urine dipstick NEGATIVE NEGATIVE   Bilirubin Urine NEGATIVE NEGATIVE   Ketones, ur NEGATIVE NEGATIVE mg/dL   Protein, ur NEGATIVE NEGATIVE mg/dL   Nitrite NEGATIVE NEGATIVE   Leukocytes,Ua NEGATIVE NEGATIVE  Protein / creatinine ratio, urine     Status: None   Collection Time: 07/25/21 10:01 AM  Result Value Ref Range   Creatinine, Urine 56.73 mg/dL   Total Protein, Urine <6 mg/dL   Protein Creatinine Ratio        0.00 - 0.15 mg/mg[Cre]  Resp Panel by RT-PCR (Flu A&B,  Covid) Nasopharyngeal Swab     Status: None   Collection Time: 07/25/21 11:00 AM   Specimen: Nasopharyngeal Swab; Nasopharyngeal(NP) swabs in vial transport medium  Result Value Ref Range   SARS Coronavirus 2 by RT PCR NEGATIVE NEGATIVE   Influenza A by PCR NEGATIVE NEGATIVE   Influenza B by PCR NEGATIVE NEGATIVE  CBC with Differential/Platelet     Status: Abnormal   Collection Time: 07/25/21 11:01 AM  Result Value Ref Range   WBC 9.1 4.0 - 10.5 K/uL   RBC 3.73 (L) 3.87 - 5.11 MIL/uL   Hemoglobin 11.5 (L) 12.0 - 15.0 g/dL   HCT 15.8 (L) 30.9 - 40.7 %   MCV 93.6 80.0 - 100.0 fL   MCH 30.8 26.0 - 34.0 pg   MCHC 33.0 30.0 - 36.0 g/dL   RDW 68.0 88.1 - 10.3 %   Platelets 222 150 - 400 K/uL   nRBC 0.0 0.0 - 0.2 %   Neutrophils Relative % 81 %   Neutro Abs 7.4 1.7 - 7.7 K/uL   Lymphocytes Relative 13 %   Lymphs Abs 1.2 0.7 - 4.0 K/uL   Monocytes Relative 6 %   Monocytes Absolute 0.5 0.1 - 1.0 K/uL   Eosinophils Relative 0 %   Eosinophils Absolute 0.0 0.0 - 0.5 K/uL   Basophils Relative 0 %   Basophils Absolute 0.0 0.0 - 0.1 K/uL   Immature Granulocytes 0 %   Abs Immature Granulocytes 0.02 0.00 - 0.07 K/uL  Comprehensive metabolic panel     Status: Abnormal   Collection Time: 07/25/21 11:01 AM  Result Value Ref Range   Sodium 135 135 - 145 mmol/L   Potassium 3.9 3.5 - 5.1 mmol/L   Chloride 104 98 - 111 mmol/L   CO2 23 22 - 32 mmol/L   Glucose, Bld 91 70 - 99 mg/dL   BUN 7 6 - 20 mg/dL   Creatinine,  Ser 1.59 0.44 - 1.00 mg/dL   Calcium 9.1 8.9 - 45.8 mg/dL   Total Protein 6.6 6.5 - 8.1 g/dL   Albumin 3.2 (L) 3.5 - 5.0 g/dL   AST 19 15 - 41 U/L   ALT 21 0 - 44 U/L   Alkaline Phosphatase 61 38 - 126 U/L   Total Bilirubin 0.3 0.3 - 1.2 mg/dL   GFR, Estimated >59 >29 mL/min   Anion gap 8 5 - 15   No results found.  MAU Course  Procedures  MDM -pt with new onset HA in setting of sinus infection x2-3days without relief from home remedies and Tylenol -1L LR + PO Tylenol 1000mg  + 10mg  IV Reglan + 12.5mg  Benadryl given -HA from 4/10 at rest to 0/10 -UA: hazy, otherwise WNL -CBC: WNL -CMP: WNL for pregnancy -PCr: below reportable range -COVID/Flu: negative -pt discharged to home in stable condition  Orders Placed This Encounter  Procedures   Resp Panel by RT-PCR (Flu A&B, Covid) Nasopharyngeal Swab    Standing Status:   Standing    Number of Occurrences:   1    Order Specific Question:   Is this test for diagnosis or screening    Answer:   Diagnosis of ill patient    Order Specific Question:   Symptomatic for COVID-19 as defined by CDC    Answer:   Yes    Order Specific Question:   Date of Symptom Onset    Answer:   07/18/2021    Order Specific Question:   Hospitalized for COVID-19  Answer:   No    Order Specific Question:   Admitted to ICU for COVID-19    Answer:   No    Order Specific Question:   Previously tested for COVID-19    Answer:   Yes    Order Specific Question:   Resident in a congregate (group) care setting    Answer:   No    Order Specific Question:   Employed in healthcare setting    Answer:   Unknown    Order Specific Question:   Pregnant    Answer:   Yes    Order Specific Question:   Has patient completed COVID vaccination(s) (2 doses of Pfizer/Moderna 1 dose of [employer])    Answer:   No   Urinalysis, Routine w reflex microscopic Urine, Clean Catch    Standing Status:   Standing    Number of Occurrences:   1   CBC with Differential/Platelet     Standing Status:   Standing    Number of Occurrences:   1   Comprehensive metabolic panel    Standing Status:   Standing    Number of Occurrences:   1   Protein / creatinine ratio, urine    Standing Status:   Standing    Number of Occurrences:   1   Airborne and Contact precautions    Standing Status:   Standing    Number of Occurrences:   1   Insert peripheral IV    Standing Status:   Standing    Number of Occurrences:   1   Discharge patient    Order Specific Question:   Discharge disposition    Answer:   01-Home or Self Care [1]    Order Specific Question:   Discharge patient date    Answer:   07/25/2021   Meds ordered this encounter  Medications   lactated ringers bolus 1,000 mL   acetaminophen (TYLENOL) tablet 1,000 mg   metoCLOPramide (REGLAN) injection 10 mg   diphenhydrAMINE (BENADRYL) injection 12.5 mg   metoCLOPramide (REGLAN) 10 MG tablet    Sig: Take 1 tablet (10 mg total) by mouth every 8 (eight) hours as needed.    Dispense:  90 tablet    Refill:  0    Order Specific Question:   Supervising Provider    Answer:   Mariel Aloe A [[id number]]   Assessment and Plan   1. Pregnancy headache in second trimester   2. [redacted] weeks gestation of pregnancy    Allergies as of 07/25/2021       Reactions   Amoxicillin Rash   Has patient had a PCN reaction causing immediate rash, facial/tongue/throat swelling, SOB or lightheadedness with hypotension: Yes Has patient had a PCN reaction causing severe rash involving mucus membranes or skin necrosis: Yes Has patient had a PCN reaction that required hospitalization No Has patient had a PCN reaction occurring within the last 10 years: No If all of the above answers are "NO", then may proceed with Cephalosporin use.        Medication List     TAKE these medications    albuterol 108 (90 Base) MCG/ACT inhaler Commonly known as: VENTOLIN HFA Inhale 2 puffs into the lungs every 3 (three) hours as needed for wheezing or  shortness of breath.   citalopram 10 MG tablet Commonly known as: CELEXA Take 10 mg by mouth daily.   metoCLOPramide 10 MG tablet Commonly known as: REGLAN Take 1 tablet (10 mg total)  by mouth every 8 (eight) hours as needed.   multivitamin-prenatal 27-0.8 MG Tabs tablet Take 1 tablet by mouth daily at 12 noon.   ondansetron 8 MG disintegrating tablet Commonly known as: ZOFRAN-ODT Take 1 tablet (8 mg total) by mouth every 8 (eight) hours as needed for nausea or vomiting.       -return MAU precautions given -pt discharged to home in stable condition  Joni Reining E Crespin Forstrom 07/25/2021, 1:27 PM

## 2021-07-25 NOTE — MAU Note (Signed)
Pt reports to mau with c/o headache for the past 48 hours.  Pt reports she tried tylenol yesterday at 1530.  Pt reports she has also attempted sinus sprays with no relief.  Pt reports she was given zpack by pcp earlier this week for sinus infection.  Pt states headache worsens with movement or cough.  Denies vag bleeding, LOF or ctx

## 2021-10-11 ENCOUNTER — Encounter (HOSPITAL_COMMUNITY): Payer: Self-pay | Admitting: Obstetrics and Gynecology

## 2021-10-11 ENCOUNTER — Other Ambulatory Visit: Payer: Self-pay

## 2021-10-11 ENCOUNTER — Inpatient Hospital Stay (HOSPITAL_COMMUNITY)
Admission: AD | Admit: 2021-10-11 | Discharge: 2021-10-12 | Disposition: A | Discharge status: Home / Self Care | Payer: BC Managed Care – PPO | Attending: Obstetrics and Gynecology | Admitting: Obstetrics and Gynecology

## 2021-10-11 DIAGNOSIS — O4703 False labor before 37 completed weeks of gestation, third trimester: Secondary | ICD-10-CM | POA: Insufficient documentation

## 2021-10-11 DIAGNOSIS — Z880 Allergy status to penicillin: Secondary | ICD-10-CM | POA: Diagnosis not present

## 2021-10-11 DIAGNOSIS — Z3A32 32 weeks gestation of pregnancy: Secondary | ICD-10-CM | POA: Diagnosis not present

## 2021-10-11 DIAGNOSIS — O26893 Other specified pregnancy related conditions, third trimester: Secondary | ICD-10-CM | POA: Diagnosis present

## 2021-10-11 DIAGNOSIS — Z3689 Encounter for other specified antenatal screening: Secondary | ICD-10-CM

## 2021-10-11 DIAGNOSIS — O479 False labor, unspecified: Secondary | ICD-10-CM | POA: Diagnosis not present

## 2021-10-11 LAB — URINALYSIS, MICROSCOPIC (REFLEX)

## 2021-10-11 LAB — URINALYSIS, ROUTINE W REFLEX MICROSCOPIC
Bilirubin Urine: NEGATIVE
Glucose, UA: NEGATIVE mg/dL
Hgb urine dipstick: NEGATIVE
Ketones, ur: NEGATIVE mg/dL
Nitrite: NEGATIVE
Protein, ur: NEGATIVE mg/dL
Specific Gravity, Urine: 1.015 (ref 1.005–1.030)
pH: 6.5 (ref 5.0–8.0)

## 2021-10-11 MED ORDER — TERBUTALINE SULFATE 1 MG/ML IJ SOLN
0.2500 mg | Freq: Once | INTRAMUSCULAR | Status: DC
  Filled 2021-10-11: qty 1

## 2021-10-11 MED ORDER — LACTATED RINGERS IV BOLUS
1000.0000 mL | Freq: Once | INTRAVENOUS | Status: AC
  Administered 2021-10-11: 22:00:00 1000 mL via INTRAVENOUS

## 2021-10-11 NOTE — MAU Provider Note (Addendum)
History    [id number]  Arrival date and time: 10/11/21 1920   Chief Complaint  Patient presents with   Contractions   HPI Julie Fitzgerald is a 25 y.o. at [redacted]w[redacted]d who presents to MAU with contractions. Patient states that she has been having contractions on and off all week. She then started having contractions regularly for 2 hours prior to arrival, therefore, she came to the MAU for further evaluation. She denies LOF or vaginal bleeding. She feels normal fetal movement. She is concerned because she had a prior preterm delivery at 36 weeks and felt similarly when she started contracting then. She is here to make sure that she is not going into preterm labor.   Vaginal bleeding: No LOF: No Fetal Movement: Yes Contractions: Yes  OB History     Gravida  2   Para  1   Term      Preterm  1   AB      Living  1      SAB      IAB      Ectopic      Multiple  0   Live Births  1           Past Medical History:  Diagnosis Date   ADD (attention deficit disorder)    Asthma    Environmental allergies    Iron deficiency     Past Surgical History:  Procedure Laterality Date   NO PAST SURGERIES      No family history on file.  Social History   Socioeconomic History   Marital status: Single    Spouse name: Not on file   Number of children: Not on file   Years of education: Not on file   Highest education level: Not on file  Occupational History   Not on file  Tobacco Use   Smoking status: Never   Smokeless tobacco: Never  Vaping Use   Vaping Use: Never used  Substance and Sexual Activity   Alcohol use: No   Drug use: No   Sexual activity: Yes  Other Topics Concern   Not on file  Social History Narrative   Not on file   Social Determinants of Health   Financial Resource Strain: Not on file  Food Insecurity: Not on file  Transportation Needs: Not on file  Physical Activity: Not on file  Stress: Not on file  Social Connections: Not on file   Intimate Partner Violence: Not on file    Allergies  Allergen Reactions   Amoxicillin Rash    Has patient had a PCN reaction causing immediate rash, facial/tongue/throat swelling, SOB or lightheadedness with hypotension: Yes Has patient had a PCN reaction causing severe rash involving mucus membranes or skin necrosis: Yes Has patient had a PCN reaction that required hospitalization No Has patient had a PCN reaction occurring within the last 10 years: No If all of the above answers are "NO", then may proceed with Cephalosporin use.     No current facility-administered medications on file prior to encounter.   Current Outpatient Medications on File Prior to Encounter  Medication Sig Dispense Refill   albuterol (PROVENTIL HFA;VENTOLIN HFA) 108 (90 Base) MCG/ACT inhaler Inhale 2 puffs into the lungs every 3 (three) hours as needed for wheezing or shortness of breath.     citalopram (CELEXA) 10 MG tablet Take 10 mg by mouth daily.     metoCLOPramide (REGLAN) 10 MG tablet Take 1 tablet (10 mg total) by mouth  every 8 (eight) hours as needed. 90 tablet 0   ondansetron (ZOFRAN-ODT) 8 MG disintegrating tablet Take 1 tablet (8 mg total) by mouth every 8 (eight) hours as needed for nausea or vomiting. 30 tablet 0   Prenatal Vit-Fe Fumarate-FA (MULTIVITAMIN-PRENATAL) 27-0.8 MG TABS tablet Take 1 tablet by mouth daily at 12 noon.      ROS Pertinent positives and negative per HPI, all others reviewed and negative.  Physical Exam   BP 113/67    Pulse 70    Temp 98.2 F (36.8 C)    Resp 16    Ht 5\' 4"  (1.626 m)    Wt 80.7 kg    SpO2 100%    BMI 30.55 kg/m   Physical Exam Exam conducted with a chaperone present.  Constitutional:      General: She is not in acute distress.    Appearance: Normal appearance.  HENT:     Head: Normocephalic and atraumatic.     Mouth/Throat:     Mouth: Mucous membranes are moist.  Eyes:     Extraocular Movements: Extraocular movements intact.      Conjunctiva/sclera: Conjunctivae normal.  Cardiovascular:     Rate and Rhythm: Normal rate.  Pulmonary:     Effort: Pulmonary effort is normal.  Abdominal:     Palpations: Abdomen is soft.     Tenderness: There is no abdominal tenderness.  Genitourinary:    General: Normal vulva.  Musculoskeletal:     Right lower leg: No edema.     Left lower leg: No edema.  Skin:    General: Skin is warm and dry.  Neurological:     General: No focal deficit present.     Mental Status: She is alert and oriented to person, place, and time.  Psychiatric:        Mood and Affect: Mood normal.        Behavior: Behavior normal.   Cervical Exam Dilation: Closed Effacement (%): 50 Cervical Position: Posterior Station: Ballotable Exam by:: Dr. [email or web address] Baseline 135 bpm, moderate variability, 15 x 15 accels, no decels Toco: Contractions every 3-6 minutes  Cat: 1; reactive  Labs Results for orders placed or performed during the hospital encounter of 10/11/21 (from the past 24 hour(s))  Urinalysis, Routine w reflex microscopic Urine, Clean Catch     Status: Abnormal   Collection Time: 10/11/21  7:40 PM  Result Value Ref Range   Color, Urine YELLOW YELLOW   APPearance CLEAR CLEAR   Specific Gravity, Urine 1.015 1.005 - 1.030   pH 6.5 5.0 - 8.0   Glucose, UA NEGATIVE NEGATIVE mg/dL   Hgb urine dipstick NEGATIVE NEGATIVE   Bilirubin Urine NEGATIVE NEGATIVE   Ketones, ur NEGATIVE NEGATIVE mg/dL   Protein, ur NEGATIVE NEGATIVE mg/dL   Nitrite NEGATIVE NEGATIVE   Leukocytes,Ua TRACE (A) NEGATIVE  Urinalysis, Microscopic (reflex)     Status: Abnormal   Collection Time: 10/11/21  7:40 PM  Result Value Ref Range   RBC / HPF 0-5 0 - 5 RBC/hpf   WBC, UA 0-5 0 - 5 WBC/hpf   Bacteria, UA RARE (A) NONE SEEN   Squamous Epithelial / LPF 0-5 0 - 5   Mucus PRESENT     Imaging No results found.  MAU Course  Procedures Lab Orders         Urinalysis, Routine w reflex microscopic Urine, Clean Catch          Urinalysis, Microscopic (reflex)    Meds ordered  this encounter  Medications   lactated ringers bolus 1,000 mL   terbutaline (BRETHINE) injection 0.25 mg   Imaging Orders  No imaging studies ordered today   MDM Patient presents to MAU with contractions that have become more regular over the last 2 hours prior to arrival  Hx of preterm delivery, symptoms feel similar to prior Cervix closed on exam Tocometer with contractions initially every 3-6 minutes  Planned to give IVF and terbutaline; however, contractions then started spacing  Will discuss monitoring alone versus treatment with patient Care transitioned to Edd Arbour, CNM at 2100 Please see further documentation for additional management and ultimate disposition  Worthy Rancher, MD  Assumed care of patient at 2100. Continuing to have contractions q4-61min but only lasting 30s (timed at bedside). Pt declined terbutaline but accepted IV fluids. Discussed normalcy of contractions when in need of hydration, nutrition, rest, etc. FOB affirmed that pt had been running errands all day, has been extremely busy for the past week and needs to take it easier and drink more water. Gave reassurance and reviewed how she can tell "false labor" from "real labor" and encouraged her to consider them practice contractions unless they last a full minute. Pt visibly relaxed and felt comfortable getting the bolus and a recheck in an hour.  Cervix remained unchanged, contractions still q44min but less intense. Pt stable and ok to go home.  Assessment  False labor at [redacted] weeks gestation of pregnancy Preterm contractions NST reactive  Plan  Discharge home in stable condition with preterm labor precautions Follow up at [facility] as scheduled for ongoing prenatal care  Allergies as of 10/12/2021       Reactions   Amoxicillin Rash   Has patient had a PCN reaction causing immediate rash, facial/tongue/throat  swelling, SOB or lightheadedness with hypotension: Yes Has patient had a PCN reaction causing severe rash involving mucus membranes or skin necrosis: Yes Has patient had a PCN reaction that required hospitalization No Has patient had a PCN reaction occurring within the last 10 years: No If all of the above answers are "NO", then may proceed with Cephalosporin use.        Medication List     TAKE these medications    albuterol 108 (90 Base) MCG/ACT inhaler Commonly known as: VENTOLIN HFA Inhale 2 puffs into the lungs every 3 (three) hours as needed for wheezing or shortness of breath.   citalopram 10 MG tablet Commonly known as: CELEXA Take 10 mg by mouth daily.   metoCLOPramide 10 MG tablet Commonly known as: REGLAN Take 1 tablet (10 mg total) by mouth every 8 (eight) hours as needed.   multivitamin-prenatal 27-0.8 MG Tabs tablet Take 1 tablet by mouth daily at 12 noon.   ondansetron 8 MG disintegrating tablet Commonly known as: ZOFRAN-ODT Take 1 tablet (8 mg total) by mouth every 8 (eight) hours as needed for nausea or vomiting.        Edd Arbour, CNM, MSN, IBCLC Certified Nurse Midwife, [facility]

## 2021-10-11 NOTE — MAU Note (Signed)
I've had a couple ctxs this wk. This afternoon had several strong ones and then they went away after I showered. From 1700 to 1900 they have spaced out alittle and not as intense. I decided to come in as my son was born at 59wks. Was having some lower back pain and pelvic pressure. No pain now just pelvic pressure. Denies VB or LOF. Has appt tomorrow at office.

## 2021-11-16 ENCOUNTER — Encounter (HOSPITAL_COMMUNITY): Payer: Self-pay | Admitting: Obstetrics and Gynecology

## 2021-11-16 ENCOUNTER — Inpatient Hospital Stay (HOSPITAL_COMMUNITY)
Admission: AD | Admit: 2021-11-16 | Discharge: 2021-11-16 | Disposition: A | Discharge status: Home / Self Care | Payer: BC Managed Care – PPO | Attending: Obstetrics and Gynecology | Admitting: Obstetrics and Gynecology

## 2021-11-16 ENCOUNTER — Other Ambulatory Visit: Payer: Self-pay

## 2021-11-16 DIAGNOSIS — N898 Other specified noninflammatory disorders of vagina: Secondary | ICD-10-CM | POA: Diagnosis not present

## 2021-11-16 DIAGNOSIS — Z3689 Encounter for other specified antenatal screening: Secondary | ICD-10-CM

## 2021-11-16 DIAGNOSIS — Z3A37 37 weeks gestation of pregnancy: Secondary | ICD-10-CM | POA: Insufficient documentation

## 2021-11-16 DIAGNOSIS — Z3490 Encounter for supervision of normal pregnancy, unspecified, unspecified trimester: Secondary | ICD-10-CM

## 2021-11-16 DIAGNOSIS — O26893 Other specified pregnancy related conditions, third trimester: Secondary | ICD-10-CM | POA: Insufficient documentation

## 2021-11-16 LAB — AMNISURE RUPTURE OF MEMBRANE (ROM) NOT AT ARMC: Amnisure ROM: NEGATIVE

## 2021-11-16 LAB — POCT FERN TEST: POCT Fern Test: NEGATIVE

## 2021-11-16 NOTE — MAU Provider Note (Signed)
History     CSN: [id number]  Arrival date and time: 11/16/21 1436   None     Chief Complaint  Patient presents with   Contractions   Rupture of Membranes   HPI Julie Fitzgerald is a 26 y.o. G2P0101 at [redacted]w[redacted]d who presents to MAU for leaking fluid. Patient reports around 1230 she had a gush of clear fluid. She reports it completely soaked through her pants and dripped onto the floor. She reports there were 2 small puddles on the floor. She reports she has since had several other small trickles. Reports some mild cramping, but denies vaginal bleeding, itching/odor. Endorses active fetal movement.   Patient receives [facility]. Prenatal records reviewed.   OB History     Gravida  2   Para  1   Term      Preterm  1   AB      Living  1      SAB      IAB      Ectopic      Multiple  0   Live Births  1           Past Medical History:  Diagnosis Date   ADD (attention deficit disorder)    Asthma    Environmental allergies    Iron deficiency     Past Surgical History:  Procedure Laterality Date   NO PAST SURGERIES     WISDOM TOOTH EXTRACTION      History reviewed. No pertinent family history.  Social History   Tobacco Use   Smoking status: Never   Smokeless tobacco: Never  Vaping Use   Vaping Use: Never used  Substance Use Topics   Alcohol use: No   Drug use: No    Allergies:  Allergies  Allergen Reactions   Amoxicillin Rash    Has patient had a PCN reaction causing immediate rash, facial/tongue/throat swelling, SOB or lightheadedness with hypotension: Yes Has patient had a PCN reaction causing severe rash involving mucus membranes or skin necrosis: Yes Has patient had a PCN reaction that required hospitalization No Has patient had a PCN reaction occurring within the last 10 years: No If all of the above answers are "NO", then may proceed with Cephalosporin use.     No medications prior to admission.    Review of  Systems  Constitutional: Negative.   Respiratory: Negative.    Cardiovascular: Negative.   Gastrointestinal:  Positive for abdominal pain (cramping).  Genitourinary:  Positive for vaginal discharge. Negative for dysuria, frequency and vaginal bleeding.  Musculoskeletal: Negative.   Neurological: Negative.    Physical Exam   Blood pressure 132/74, pulse 92, temperature 99.4 F (37.4 C), temperature source Oral, resp. rate 18, SpO2 99 %, unknown if currently breastfeeding.  Physical Exam Vitals and nursing note reviewed. Exam conducted with a chaperone present.  Constitutional:      General: She is not in acute distress. HENT:     Head: Normocephalic.  Eyes:     Extraocular Movements: Extraocular movements intact.     Pupils: Pupils are equal, round, and reactive to light.  Cardiovascular:     Rate and Rhythm: Tachycardia present.  Pulmonary:     Effort: Pulmonary effort is normal.  Abdominal:     Palpations: Abdomen is soft.     Tenderness: There is no abdominal tenderness.     Comments: Gravid   Genitourinary:    Comments: NEFG, vaginal walls pink with rugae, small amount of  thin watery discharge pooling, no bleeding, cervix visually 1-2cm without lesion/masses VE: 1/50/ballotable Musculoskeletal:        General: Normal range of motion.     Cervical back: Normal range of motion.  Skin:    General: Skin is warm and dry.  Neurological:     General: No focal deficit present.     Mental Status: She is alert and oriented to person, place, and time.  Psychiatric:        Mood and Affect: Mood normal.        Behavior: Behavior normal.        Thought Content: Thought content normal.   NST FHR: 135 bpm, moderate variability, +15x15 accels, no decels Toco: Q  MAU Course  Procedures NST SSE Fern slide Amnisure   MDM Sterile speculum exam with small amount of watery discharge. Fern slide was negative, but given patients story and watery discharge on exam, an  amnisure was ordered to rule ruptured membranes. Amnisure negative. Cervix unchanged from previous office visit. At this time, membranes are intact and patient is not in active labor. Patient is stable for discharge home with close follow up in the office.  Assessment and Plan  [redacted] weeks gestation of pregnancy Vaginal discharge Membranes intact NST reactive   - Discharge home in stable condition - Strict return precautions reviewed. Patient may return to MAU sooner or as needed for worsening symptoms - Keep OB appointment as scheduled on Thursday.    Brand Males, CNM 11/16/2021, 4:35 PM

## 2021-11-16 NOTE — MAU Note (Signed)
.  Julie Fitzgerald is a 26 y.o. at [redacted]w[redacted]d here in MAU reporting: Large gush of clear fluid at 1230. States she has had some continuous leakage and one other large gush. Denies VB. Endorses fetal movement. Having lower abdominal cramping and lower back pain.   Pain score: 4

## 2021-11-20 ENCOUNTER — Inpatient Hospital Stay (EMERGENCY_DEPARTMENT_HOSPITAL)
Admission: AD | Admit: 2021-11-20 | Discharge: 2021-11-20 | Disposition: A | Discharge status: Home / Self Care | Payer: BC Managed Care – PPO | Source: Home / Self Care | Attending: Obstetrics and Gynecology | Admitting: Obstetrics and Gynecology

## 2021-11-20 ENCOUNTER — Inpatient Hospital Stay (HOSPITAL_COMMUNITY)
Admission: AD | Admit: 2021-11-20 | Discharge: 2021-11-21 | DRG: 807 | Disposition: A | Discharge status: Home / Self Care | Payer: BC Managed Care – PPO | Attending: Obstetrics and Gynecology | Admitting: Obstetrics and Gynecology

## 2021-11-20 ENCOUNTER — Encounter (HOSPITAL_COMMUNITY): Payer: Self-pay | Admitting: Obstetrics and Gynecology

## 2021-11-20 ENCOUNTER — Other Ambulatory Visit: Payer: Self-pay

## 2021-11-20 DIAGNOSIS — Z880 Allergy status to penicillin: Secondary | ICD-10-CM | POA: Diagnosis not present

## 2021-11-20 DIAGNOSIS — O471 False labor at or after 37 completed weeks of gestation: Secondary | ICD-10-CM

## 2021-11-20 DIAGNOSIS — Z20822 Contact with and (suspected) exposure to covid-19: Secondary | ICD-10-CM | POA: Diagnosis present

## 2021-11-20 DIAGNOSIS — Z3A37 37 weeks gestation of pregnancy: Secondary | ICD-10-CM | POA: Insufficient documentation

## 2021-11-20 LAB — CBC
HCT: 38 % (ref 36.0–46.0)
Hemoglobin: 12.5 g/dL (ref 12.0–15.0)
MCH: 29.9 pg (ref 26.0–34.0)
MCHC: 32.9 g/dL (ref 30.0–36.0)
MCV: 90.9 fL (ref 80.0–100.0)
Platelets: 266 10*3/uL (ref 150–400)
RBC: 4.18 MIL/uL (ref 3.87–5.11)
RDW: 13.4 % (ref 11.5–15.5)
WBC: 20.6 10*3/uL — ABNORMAL HIGH (ref 4.0–10.5)
nRBC: 0 % (ref 0.0–0.2)

## 2021-11-20 LAB — TYPE AND SCREEN
ABO/RH(D): A POS
Antibody Screen: NEGATIVE

## 2021-11-20 LAB — RESP PANEL BY RT-PCR (FLU A&B, COVID) ARPGX2
Influenza A by PCR: NEGATIVE
Influenza B by PCR: NEGATIVE
SARS Coronavirus 2 by RT PCR: NEGATIVE

## 2021-11-20 MED ORDER — CYCLOBENZAPRINE HCL 5 MG PO TABS
10.0000 mg | ORAL_TABLET | Freq: Once | ORAL | Status: AC
  Administered 2021-11-20: 10 mg via ORAL
  Filled 2021-11-20: qty 2

## 2021-11-20 MED ORDER — OXYTOCIN-SODIUM CHLORIDE 30-0.9 UT/500ML-% IV SOLN: 2.5000 [IU]/h | INTRAVENOUS | Status: DC

## 2021-11-20 MED ORDER — DIBUCAINE (PERIANAL) 1 % EX OINT: 1.0000 "application " | TOPICAL_OINTMENT | CUTANEOUS | Status: DC | PRN

## 2021-11-20 MED ORDER — ONDANSETRON HCL 4 MG/2ML IJ SOLN: 4.0000 mg | INTRAMUSCULAR | Status: DC | PRN

## 2021-11-20 MED ORDER — LACTATED RINGERS IV SOLN: 500.0000 mL | INTRAVENOUS | Status: DC | PRN

## 2021-11-20 MED ORDER — TETANUS-DIPHTH-ACELL PERTUSSIS 5-2.5-18.5 LF-MCG/0.5 IM SUSY: 0.5000 mL | PREFILLED_SYRINGE | Freq: Once | INTRAMUSCULAR | Status: DC

## 2021-11-20 MED ORDER — ONDANSETRON HCL 4 MG PO TABS: 4.0000 mg | ORAL_TABLET | ORAL | Status: DC | PRN

## 2021-11-20 MED ORDER — OXYTOCIN BOLUS FROM INFUSION: 333.0000 mL | Freq: Once | INTRAVENOUS | Status: DC

## 2021-11-20 MED ORDER — WITCH HAZEL-GLYCERIN EX PADS: 1.0000 "application " | MEDICATED_PAD | CUTANEOUS | Status: DC | PRN

## 2021-11-20 MED ORDER — SIMETHICONE 80 MG PO CHEW: 80.0000 mg | CHEWABLE_TABLET | ORAL | Status: DC | PRN

## 2021-11-20 MED ORDER — OXYTOCIN 10 UNIT/ML IJ SOLN
INTRAMUSCULAR | Status: AC
  Filled 2021-11-20: qty 1

## 2021-11-20 MED ORDER — OXYTOCIN 10 UNIT/ML IJ SOLN
10.0000 [IU] | Freq: Once | INTRAMUSCULAR | Status: AC
  Administered 2021-11-20: 10 [IU] via INTRAMUSCULAR

## 2021-11-20 MED ORDER — DIPHENHYDRAMINE HCL 25 MG PO CAPS: 25.0000 mg | ORAL_CAPSULE | Freq: Four times a day (QID) | ORAL | Status: DC | PRN

## 2021-11-20 MED ORDER — IBUPROFEN 600 MG PO TABS
600.0000 mg | ORAL_TABLET | Freq: Four times a day (QID) | ORAL | Status: DC
  Administered 2021-11-20 – 2021-11-21 (×4): 600 mg via ORAL
  Filled 2021-11-20 (×4): qty 1

## 2021-11-20 MED ORDER — ONDANSETRON HCL 4 MG/2ML IJ SOLN: 4.0000 mg | Freq: Four times a day (QID) | INTRAMUSCULAR | Status: DC | PRN

## 2021-11-20 MED ORDER — ACETAMINOPHEN 325 MG PO TABS: 650.0000 mg | ORAL_TABLET | ORAL | Status: DC | PRN

## 2021-11-20 MED ORDER — OXYCODONE-ACETAMINOPHEN 5-325 MG PO TABS
1.0000 | ORAL_TABLET | ORAL | Status: DC | PRN
  Administered 2021-11-20: 1 via ORAL
  Filled 2021-11-20: qty 1

## 2021-11-20 MED ORDER — LACTATED RINGERS IV BOLUS
1000.0000 mL | Freq: Once | INTRAVENOUS | Status: AC
  Administered 2021-11-20: 1000 mL via INTRAVENOUS

## 2021-11-20 MED ORDER — COCONUT OIL OIL: 1.0000 "application " | TOPICAL_OIL | Status: DC | PRN

## 2021-11-20 MED ORDER — ERYTHROMYCIN 5 MG/GM OP OINT
TOPICAL_OINTMENT | OPHTHALMIC | Status: AC
  Filled 2021-11-20: qty 1

## 2021-11-20 MED ORDER — OXYCODONE HCL 5 MG PO TABS: 10.0000 mg | ORAL_TABLET | ORAL | Status: DC | PRN

## 2021-11-20 MED ORDER — FENTANYL CITRATE (PF) 100 MCG/2ML IJ SOLN
100.0000 ug | INTRAMUSCULAR | Status: DC | PRN
  Administered 2021-11-20: 100 ug via INTRAVENOUS
  Filled 2021-11-20: qty 2

## 2021-11-20 MED ORDER — PRENATAL MULTIVITAMIN CH
1.0000 | ORAL_TABLET | Freq: Every day | ORAL | Status: DC
  Administered 2021-11-21: 1 via ORAL
  Filled 2021-11-20: qty 1

## 2021-11-20 MED ORDER — LACTATED RINGERS IV SOLN: INTRAVENOUS | Status: DC

## 2021-11-20 MED ORDER — BENZOCAINE-MENTHOL 20-0.5 % EX AERO
1.0000 "application " | INHALATION_SPRAY | CUTANEOUS | Status: DC | PRN
  Administered 2021-11-20: 1 via TOPICAL
  Filled 2021-11-20: qty 56

## 2021-11-20 MED ORDER — SOD CITRATE-CITRIC ACID 500-334 MG/5ML PO SOLN: 30.0000 mL | ORAL | Status: DC | PRN

## 2021-11-20 MED ORDER — LIDOCAINE HCL (PF) 1 % IJ SOLN
30.0000 mL | INTRAMUSCULAR | Status: AC | PRN
  Administered 2021-11-20: 30 mL via SUBCUTANEOUS
  Filled 2021-11-20: qty 30

## 2021-11-20 MED ORDER — OXYCODONE-ACETAMINOPHEN 5-325 MG PO TABS: 2.0000 | ORAL_TABLET | ORAL | Status: DC | PRN

## 2021-11-20 MED ORDER — OXYCODONE HCL 5 MG PO TABS: 5.0000 mg | ORAL_TABLET | ORAL | Status: DC | PRN

## 2021-11-20 MED ORDER — ZOLPIDEM TARTRATE 5 MG PO TABS: 5.0000 mg | ORAL_TABLET | Freq: Every evening | ORAL | Status: DC | PRN

## 2021-11-20 MED ORDER — ACETAMINOPHEN 325 MG PO TABS
650.0000 mg | ORAL_TABLET | ORAL | Status: DC | PRN
  Administered 2021-11-20 – 2021-11-21 (×2): 650 mg via ORAL
  Filled 2021-11-20 (×2): qty 2

## 2021-11-20 MED ORDER — SENNOSIDES-DOCUSATE SODIUM 8.6-50 MG PO TABS
2.0000 | ORAL_TABLET | ORAL | Status: DC
  Administered 2021-11-20: 2 via ORAL
  Filled 2021-11-20: qty 2

## 2021-11-20 NOTE — Progress Notes (Signed)
Pt family member came into hallway where this RN was sitting to alert that pt felt an increase in bleeding. This RN went into room to assess patient. Fundus was to the right and 2/U, with a small trickle of blood expelled during massage. RN ambulated patient to bathroom where she voided a large amount. Pt returned to bed and RN checked fundus again, now at U/1 and midline, scant bleeding. Patient tolerated ambulation to bathroom well. Pt's primary RN S. Obsorne updated.

## 2021-11-20 NOTE — H&P (Signed)
Julie Fitzgerald is a 26 y.o. female was in MAU this am. After 5 hours of observation the FHT was reactive, Cx unchanged at 2/50% and UCs about q5-9 minutes. She was discharged home. She then had SROM and rapid progression. She delivered a VMI in the squad on the way to the hospital. Now has some cramps and placenta not yet delivered. OB History     Gravida  2   Para  1   Term      Preterm  1   AB      Living  1      SAB      IAB      Ectopic      Multiple  0   Live Births  1          Past Medical History:  Diagnosis Date   ADD (attention deficit disorder)    Asthma    Environmental allergies    Iron deficiency    Past Surgical History:  Procedure Laterality Date   NO PAST SURGERIES     WISDOM TOOTH EXTRACTION     Family History: family history is not on file. Social History:  reports that she has never smoked. She has never used smokeless tobacco. She reports that she does not drink alcohol and does not use drugs.     Maternal Diabetes: No Genetic Screening: Normal Maternal Ultrasounds/Referrals: Normal Fetal Ultrasounds or other Referrals:  None Maternal Substance Abuse:  No Significant Maternal Medications:  None Significant Maternal Lab Results:  Group B Strep negative Other Comments:  None  Review of Systems  Constitutional:  Negative for fever.  History   Blood pressure (!) 141/66, pulse 88, temperature 98.2 F (36.8 C), temperature source Oral, resp. rate 18, unknown if currently breastfeeding. Exam Physical Exam Cardiovascular:     Rate and Rhythm: Normal rate.  Pulmonary:     Effort: Pulmonary effort is normal.    NAD with baby skin to skin on mom's chest.  Prenatal labs: ABO, Rh: --/--/PENDING (01/28 0845) Antibody: PENDING (01/28 0845) Rubella:   RPR:    HBsAg:    HIV:    GBS:     Assessment/Plan: 26 yo G2P2 S/P SVD   Shon Millet II 11/20/2021, 2:40 PM

## 2021-11-20 NOTE — MAU Provider Note (Addendum)
Chief Complaint:  Contractions   Event Date/Time   First Provider Initiated Contact with Patient 11/20/21 0827      HPI: Julie Fitzgerald is a 26 y.o. G2P0101 at [redacted]w[redacted]d who presents to maternity admissions for labor evaluation.   She reports good fetal movement, denies LOF, vaginal bleeding, vaginal itching/burning, urinary symptoms, h/a, dizziness, n/v, or fever/chills.     HPI  Past Medical History: Past Medical History:  Diagnosis Date   ADD (attention deficit disorder)    Asthma    Environmental allergies    Iron deficiency     Past obstetric history: OB History  Gravida Para Term Preterm AB Living  2 1   1   1   SAB IAB Ectopic Multiple Live Births        0 1    # Outcome Date GA Lbr Len/2nd Weight Sex Delivery Anes PTL Lv  2 Current           1 Preterm 09/25/18 [redacted]w[redacted]d 12:25 / 00:32 2430 g M Vag-Spont EPI  LIV    Past Surgical History: Past Surgical History:  Procedure Laterality Date   NO PAST SURGERIES     WISDOM TOOTH EXTRACTION      Family History: History reviewed. No pertinent family history.  Social History: Social History   Tobacco Use   Smoking status: Never   Smokeless tobacco: Never  Vaping Use   Vaping Use: Never used  Substance Use Topics   Alcohol use: No   Drug use: No    Allergies:  Allergies  Allergen Reactions   Amoxicillin Rash    Has patient had a PCN reaction causing immediate rash, facial/tongue/throat swelling, SOB or lightheadedness with hypotension: Yes Has patient had a PCN reaction causing severe rash involving mucus membranes or skin necrosis: Yes Has patient had a PCN reaction that required hospitalization No Has patient had a PCN reaction occurring within the last 10 years: No If all of the above answers are "NO", then may proceed with Cephalosporin use.     Meds:  Medications Prior to Admission  Medication Sig Dispense Refill Last Dose   docusate sodium (COLACE) 100 MG capsule Take 100 mg by mouth 2 (two) times  daily.   11/19/2021   Prenatal Vit-Fe Fumarate-FA (MULTIVITAMIN-PRENATAL) 27-0.8 MG TABS tablet Take 1 tablet by mouth daily at 12 noon.   11/19/2021   albuterol (PROVENTIL HFA;VENTOLIN HFA) 108 (90 Base) MCG/ACT inhaler Inhale 2 puffs into the lungs every 3 (three) hours as needed for wheezing or shortness of breath.   More than a month   citalopram (CELEXA) 10 MG tablet Take 10 mg by mouth daily.   More than a month   metoCLOPramide (REGLAN) 10 MG tablet Take 1 tablet (10 mg total) by mouth every 8 (eight) hours as needed. 90 tablet 0 More than a month   ondansetron (ZOFRAN-ODT) 8 MG disintegrating tablet Take 1 tablet (8 mg total) by mouth every 8 (eight) hours as needed for nausea or vomiting. 30 tablet 0 More than a month    ROS:  Review of Systems  Constitutional:  Negative for chills, fatigue and fever.  Eyes:  Negative for visual disturbance.  Respiratory:  Negative for shortness of breath.   Cardiovascular:  Negative for chest pain.  Gastrointestinal:  Positive for abdominal pain. Negative for nausea and vomiting.  Genitourinary:  Negative for difficulty urinating, dysuria, flank pain, pelvic pain, vaginal bleeding, vaginal discharge and vaginal pain.  Musculoskeletal:  Positive for back pain.  Neurological:  Negative for dizziness and headaches.  Psychiatric/Behavioral: Negative.      I have reviewed patient's Past Medical Hx, Surgical Hx, Family Hx, Social Hx, medications and allergies.   Physical Exam  Patient Vitals for the past 24 hrs:  BP Temp Temp src Pulse Resp SpO2 Height Weight  11/20/21 0725 131/70 98.4 F (36.9 C) Oral 74 16 99 % -- --  11/20/21 0545 -- -- -- -- -- 99 % -- --  11/20/21 0540 -- -- -- -- -- 99 % -- --  11/20/21 0535 -- -- -- -- -- 99 % -- --  11/20/21 0530 128/74 -- -- 82 -- 99 % -- --  11/20/21 0527 140/82 -- -- 89 -- -- -- --  11/20/21 0504 126/78 98 F (36.7 C) Oral 75 20 -- 5\' 4"  (1.626 m) 85.5 kg   Constitutional: Well-developed,  well-nourished female in no acute distress.  Cardiovascular: normal rate Respiratory: normal effort GI: Abd soft, non-tender, gravid appropriate for gestational age.  MS: Extremities nontender, no edema, normal ROM Neurologic: Alert and oriented x 4.  GU: Neg CVAT.  Dilation: 3 Effacement (%): 50 Cervical Position: Posterior Station: -2 Presentation: Vertex Exam by:: Fredda Hammed RN  FHT:  Baseline 135 , moderate variability, accelerations present, no decelerations Contractions: q 3 mins  MAU Course/MDM: Orders Placed This Encounter  Procedures   Contraction - monitoring   External fetal heart monitoring   Vaginal exam    Meds ordered this encounter  Medications   lactated ringers bolus 1,000 mL   fentaNYL (SUBLIMAZE) injection 100 mcg     NST reviewed and reactive Pt with cervical change from 2-3 in 1.5 hours in MAU, so stayed 1 additional hour and still 3 cm but much more uncomfortable Therapeutic rest with Fentanyl 100 mcg given, IV fluid bolus started RN to to recheck cervix in 1 hour  Report to Len Blalock, CNM  Fatima Blank Certified Nurse-Midwife 11/20/2021 8:28 AM   Cervix rechecked after 1.5 hours with no change. Contractions every 7-10 minutes. NST reactive.   Dr. Gaetano Net called because patient is upset with lack of cervical change and reports to RN that she is not going home. Reviewed presentation, plan of care including therapeutic rest and unchanged cervix over 5 hours. Dr. Gaetano Net states patient can be discharged home because she is less than 39 weeks and not changing her cervix. Instructions given to have patient keep scheduled appointment this week.   CNM at bedside to discuss plan of care with patient and support person. Patient very upset and labile with emotions rotating between anger and sobbing. Repeatedly reporting she is going to have a panic attack and quickly able to speak calmly with CNM. States she does not understand why she is  being sent home because she had a baby at 36 weeks last pregnancy. Discussed with patient again that she is not in active labor at this time and CNM cannot speak to events with last pregnancy. Discussed normalcy of latent labor at this gestation and feeling more contractions with each pregnancy. Reviewed that patient has been in MAU for 5.5 hours and has had minimal cervical change and this does not indicated rapid labor or risk for precipitous delivery at this time. CNM emphasized that she is not able to predict what may or may not happen over the next several hours or days. Offered patient muscle relaxer or ambien with discharge but cautioned patient that each is less strong than the therapeutic rest she has  already received and may not provide the resolution of pain that she desires.   Discussed with patient again that her MD has been consulted and orders discharge at this time. Patient states that she "hates Dr. Gaetano Net and if Dr. Corinna Capra was here, he would understand." Encouraged patient to make appointment with primary MD in the office on Monday so that she can discuss IOL plan at that time. Discussed that active labor is considered to be 6cm and she must change her cervix to be admitted. Patient states she does not want to talk to CNM anymore or Dr. Gaetano Net and requesting to speak with someone else.   Brianne RN [facility] called to bedside to speak with patient. RN discussed recommendations as above and offer for muscle relaxer or ambien for rest. Patient states she would like flexeril before discharge understanding that this may not help resolve her contractions.   Flexeril PO  1. False labor after 37 completed weeks of gestation   2. [redacted] weeks gestation of pregnancy    -Discharge home in stable condition -Labor precautions discussed -Patient advised to follow-up with primary OB on Monday for prenatal care -Patient may return to MAU as needed or if her condition were to change or  worsen  Wende Mott, CNM 11/20/21 10:40 AM

## 2021-11-20 NOTE — Discharge Instructions (Signed)

## 2021-11-20 NOTE — Progress Notes (Signed)
Patient ID: Julie Fitzgerald, female   DOB: January 18, 1996, 26 y.o.   MRN: [id number] Arrived via EMS Had called them to report ROM They began transport and patient delivered on the way to the hospital Delivered by paramedic  Placenta still in place now AVSS  Dr Huntley Dec has already been notified and is on the way to complete delivery  Aviva Signs, CNM

## 2021-11-20 NOTE — Progress Notes (Signed)
Delivery Note  Patient delivered in squad on way to hospital. Baby on mom's chest doing well.  Patient in NAD.  Patient placed in stirrups. Placenta easily delivers, intact, sent to path. Uterus involutes well, IM pitocin given.  First degree midline anterior laceration, not bleeding, not repaired. Second degree midline laceration @ 6:00, 1% lidocaine local and 3-0 Vicryl Rapide figure of eight stitch.  EBL in room 162ml.  Patient and infant stable in LDR.

## 2021-11-20 NOTE — MAU Note (Signed)
PT SAYS UC STRONGER SINCE 0300 PNC WITH  DR MORRIS  VE - 2 CM- ON Thursday  GBS- NEG DENIES HSV

## 2021-11-20 NOTE — Lactation Note (Signed)
This note was copied from a baby's chart. Lactation Consultation Note  Patient Name: Julie Fitzgerald [id number] Date: 11/20/2021 Reason for consult: Initial assessment;Early term 37-38.6wks;Infant < 6lbs Age:25 hours Mom is experienced at breastfeeding see Maternal data below, per mom, infant had breastfeed twice 40 minutes in L&D and again [facility] for 20 minutes at 1740 pm. LC did not observe latch at this time. Mom feels breastfeeding is going well. She was given hand pump by RN on [facility] and  she already knows how to hand express and she has give infant some of her colostrum by spoon. Mom doesn't have any questions for LC at this time and she feels breastfeeding is going well. Mom knows to breastfeed infant according to cues, 8 to 12+ or more times within 24 hours, skin to skin. Mom knows to call RN/LC if she has BF questions, concerns or need assistance with latching infant at the breast.  Mom made aware of O/P services, breastfeeding support groups, community resources, and our phone # for post-discharge questions.   Maternal Data Has patient been taught Hand Expression?: Yes Does the patient have breastfeeding experience prior to this delivery?: Yes How long did the patient breastfeed?: Per mom, her 1st child is 3 years and she BF him for 13 months, her first child was 5 lbs 5 ounces at birth.  Feeding Mother's Current Feeding Choice: Breast Milk  LATCH Score              Lactation Tools Discussed/Used    Interventions Interventions: Breast feeding basics reviewed;Skin to skin;Position options;Hand pump;LC Services brochure  Discharge    Consult Status Consult Status: Follow-up Date: 11/21/21 Follow-up type: In-patient    Julie Fitzgerald 11/20/2021, 6:27 PM

## 2021-11-21 LAB — CBC
HCT: 31.2 % — ABNORMAL LOW (ref 36.0–46.0)
Hemoglobin: 10.4 g/dL — ABNORMAL LOW (ref 12.0–15.0)
MCH: 30.3 pg (ref 26.0–34.0)
MCHC: 33.3 g/dL (ref 30.0–36.0)
MCV: 91 fL (ref 80.0–100.0)
Platelets: 207 10*3/uL (ref 150–400)
RBC: 3.43 MIL/uL — ABNORMAL LOW (ref 3.87–5.11)
RDW: 13.7 % (ref 11.5–15.5)
WBC: 13.4 10*3/uL — ABNORMAL HIGH (ref 4.0–10.5)
nRBC: 0 % (ref 0.0–0.2)

## 2021-11-21 LAB — RPR: RPR Ser Ql: NONREACTIVE

## 2021-11-21 MED ORDER — ACETAMINOPHEN 325 MG PO TABS: 650.0000 mg | ORAL_TABLET | Freq: Four times a day (QID) | ORAL | 1 refills | Status: DC | PRN

## 2021-11-21 MED ORDER — IBUPROFEN 600 MG PO TABS: 600.0000 mg | ORAL_TABLET | Freq: Four times a day (QID) | ORAL | 1 refills | Status: DC | PRN

## 2021-11-21 NOTE — Progress Notes (Signed)
Post Partum Day 1 Subjective: no complaints, up ad lib, voiding, tolerating PO, and + flatus  Objective: Blood pressure 108/68, pulse 71, temperature 98 F (36.7 C), temperature source Oral, resp. rate 18, SpO2 99 %, unknown if currently breastfeeding.  Physical Exam:  General: alert, cooperative, and no distress Lochia: appropriate Uterine Fundus: firm Incision: healing well DVT Evaluation: No evidence of DVT seen on physical exam.  Recent Labs    11/20/21 1523 11/21/21 0443  HGB 12.5 10.4*  HCT 38.0 31.2*    Assessment/Plan: Discharge home D/W circumcision of newborn boy. Risks discussed. She states she understands and agrees.  LOS: 1 day   Shon Millet II 11/21/2021, 9:06 AM

## 2021-11-21 NOTE — Lactation Note (Signed)
This note was copied from a baby's chart. Lactation Consultation Note  Patient Name: Julie Fitzgerald [id number] Date: 11/21/2021 Reason for consult: Follow-up assessment;Infant weight loss;Early term 37-38.6wks;Infant < 6lbs;Other (Comment) (per mom baby having a circ, and baby is breast feeding well and hearing swallows. Recently fed 25 mins. LC reviewed the doc flow sheets, WNL for age. Mom and dad expressed desires to D/C today. Will F/U) Age:25 hours  Maternal Data    Feeding Mother's Current Feeding Choice: Breast Milk  LATCH Score ( Latch by the [facility] )  Latch: Grasps breast easily, tongue down, lips flanged, rhythmical sucking.  Audible Swallowing: A few with stimulation  Type of Nipple: Everted at rest and after stimulation  Comfort (Breast/Nipple): Soft / non-tender  Hold (Positioning): Assistance needed to correctly position infant at breast and maintain latch.  LATCH Score: 8   Lactation Tools Discussed/Used    Interventions    Discharge    Consult Status Consult Status: Follow-up Date: 11/21/21 Follow-up type: In-patient    [name] 11/21/2021, 9:31 AM

## 2021-11-21 NOTE — Lactation Note (Signed)
This note was copied from a baby's chart. Lactation Consultation Note  Patient Name: Boy Nicaela Treesh [id number] Date: 11/21/2021 Reason for consult: Follow-up assessment;Early term 37-38.6wks;Infant weight loss;Other (Comment) (6 % weight loss, baby still in tx nursery for circ. LC reviewed BF D/C teaching. mom aware of the [facility] resources after D/C .) Age:25 hours LC reviewed the potential for increased volume due to being a 2nd time exp BF.  And quick let down, and milk coming in quicker. Per mom had been leaking the last 2 weeks of pregnancy.  Per mom has exp Engorgement with her 1st baby. LC recommended if the 1st breast is really full prior to feeding release it down so the areola is compressible prior to latching or if the baby only feeds 1st breast release down to comfort.  LC reviewed the doc flow sheets WNL for age of baby.    Maternal Data    Feeding Mother's Current Feeding Choice: Breast Milk  LATCH Score ( Latch score by the [facility] )  Latch: Grasps breast easily, tongue down, lips flanged, rhythmical sucking.  Audible Swallowing: A few with stimulation  Type of Nipple: Everted at rest and after stimulation  Comfort (Breast/Nipple): Soft / non-tender  Hold (Positioning): Assistance needed to correctly position infant at breast and maintain latch.  LATCH Score: 8   Lactation Tools Discussed/Used    Interventions    Discharge Discharge Education: Engorgement and breast care;Warning signs for feeding baby Pump: Personal;Manual;DEBP WIC Program: No  Consult Status Consult Status: Complete Date: 11/21/21 Follow-up type: In-patient    [place] 11/21/2021, 10:50 AM

## 2021-11-22 NOTE — Discharge Summary (Signed)
Postpartum Discharge Summary  Date of Service updated1/29/23     Patient Name: Julie Fitzgerald DOB: Feb 18, 1996 MRN: [id number]  Date of admission: 11/20/2021 Delivery date:11/20/2021  Delivering provider:   Date of discharge: 11/22/2021  Admitting diagnosis: Vaginal delivery [O80] Term pregnancy delivered [O80] Intrauterine pregnancy: 106w6d    Secondary diagnosis:  Principal Problem:   Vaginal delivery Active Problems:   Term pregnancy delivered  Additional problems:     Discharge diagnosis: Term Pregnancy Delivered                                              Post partum procedures: Augmentation: N/A Complications: None  Hospital course: Onset of Labor With Vaginal Delivery      26y.o. yo [id number] 25w6das admitted in post partum having delivered in the squad during transport to hospital on 11/20/2021. Patient had an uncomplicated labor course as follows:  Membrane Rupture Time/Date: 12:55 PM ,11/20/2021   Delivery Method:Vaginal, Spontaneous  Episiotomy: None  Lacerations:  2nd degree  Patient had an uncomplicated postpartum course.  She is ambulating, tolerating a regular diet, passing flatus, and urinating well. Patient is discharged home in stable condition on 11/22/21.  Newborn Data: Birth date:11/20/2021  Birth time:1:37 PM  Gender:Female  Living status:Living  Apgars: ,  [id number]   Magnesium Sulfate received: No BMZ received: No Rhophylac:No MMR:No T-DaP:Given prenatally Flu: No Transfusion:No  Physical exam  Vitals:   11/20/21 1715 11/20/21 2117 11/21/21 0034 11/21/21 0517  BP: 113/76 102/63 (!) 104/55 108/68  Pulse: 86 73 73 71  Resp: 18 18 16 18   Temp: 99.1 F (37.3 C) 98.3 F (36.8 C) 98.4 F (36.9 C) 98 F (36.7 C)  TempSrc: Oral Oral Oral Oral  SpO2: 98% 99% 98% 99%   General: alert, cooperative, and no distress Lochia: appropriate Uterine Fundus: firm Incision: Healing well with no significant drainage DVT Evaluation: No  evidence of DVT seen on physical exam. Labs: Lab Results  Component Value Date   WBC 13.4 (H) 11/21/2021   HGB 10.4 (L) 11/21/2021   HCT 31.2 (L) 11/21/2021   MCV 91.0 11/21/2021   PLT 207 11/21/2021   CMP Latest Ref Rng & Units 07/25/2021  Glucose 70 - 99 mg/dL 91  BUN 6 - 20 mg/dL 7  Creatinine 0.44 - 1.00 mg/dL 0.57  Sodium 135 - 145 mmol/L 135  Potassium 3.5 - 5.1 mmol/L 3.9  Chloride 98 - 111 mmol/L 104  CO2 22 - 32 mmol/L 23  Calcium 8.9 - 10.3 mg/dL 9.1  Total Protein 6.5 - 8.1 g/dL 6.6  Total Bilirubin 0.3 - 1.2 mg/dL 0.3  Alkaline Phos 38 - 126 U/L 61  AST 15 - 41 U/L 19  ALT 0 - 44 U/L 21   Edinburgh Score: Edinburgh Postnatal Depression Scale Screening Tool 11/21/2021  I have been able to laugh and see the funny side of things. 0  I have looked forward with enjoyment to things. 0  I have blamed myself unnecessarily when things went wrong. 1  I have been anxious or worried for no good reason. 0  I have felt scared or panicky for no good reason. 0  Things have been getting on top of me. 0  I have been so unhappy that I have had difficulty sleeping. 0  I have felt sad or miserable.  0  I have been so unhappy that I have been crying. 0  The thought of harming myself has occurred to me. 0  Edinburgh Postnatal Depression Scale Total 1      After visit meds:  Allergies as of 11/21/2021       Reactions   Amoxicillin Rash   Has patient had a PCN reaction causing immediate rash, facial/tongue/throat swelling, SOB or lightheadedness with hypotension: Yes Has patient had a PCN reaction causing severe rash involving mucus membranes or skin necrosis: Yes Has patient had a PCN reaction that required hospitalization No Has patient had a PCN reaction occurring within the last 10 years: No If all of the above answers are "NO", then may proceed with Cephalosporin use.        Medication List     STOP taking these medications    citalopram 10 MG tablet Commonly known  as: CELEXA   docusate sodium 100 MG capsule Commonly known as: COLACE   metoCLOPramide 10 MG tablet Commonly known as: REGLAN   ondansetron 8 MG disintegrating tablet Commonly known as: ZOFRAN-ODT       TAKE these medications    acetaminophen 325 MG tablet Commonly known as: Tylenol Take 2 tablets (650 mg total) by mouth every 6 (six) hours as needed (for pain scale < 4).   albuterol 108 (90 Base) MCG/ACT inhaler Commonly known as: VENTOLIN HFA Inhale 2 puffs into the lungs every 3 (three) hours as needed for wheezing or shortness of breath.   ibuprofen 600 MG tablet Commonly known as: ADVIL Take 1 tablet (600 mg total) by mouth every 6 (six) hours as needed.   multivitamin-prenatal 27-0.8 MG Tabs tablet Take 1 tablet by mouth daily at 12 noon.         Discharge home in stable condition Infant Feeding: Breast Infant Disposition:home with mother Discharge instruction: per After Visit Summary and Postpartum booklet. Activity: Advance as tolerated. Pelvic rest for 6 weeks.  Diet: routine diet Anticipated Birth Control: Unsure Postpartum Appointment:6 weeks Additional Postpartum F/U:  Future Appointments:No future appointments. Follow up Visit:      11/22/2021 Allena Katz, MD

## 2021-11-23 LAB — SURGICAL PATHOLOGY

## 2021-12-01 ENCOUNTER — Telehealth (HOSPITAL_COMMUNITY): Payer: Self-pay | Admitting: *Deleted

## 2021-12-01 NOTE — Telephone Encounter (Signed)
Hospital Discharge Follow-Up Call:  Patient reports that she is well and has no concerns about her healing process.  EPDS today was 7 and she thinks that she is doing OK emotionally.  Encouraged patient to call her OB if she feels that she is not herself or if feeling of sadness or anxiety feel greater than normal or are interfering with her daily life.  We did not discuss the baby other than patient  saying "we are OK now that we are home".  We also did not discuss postpartum groups as patient was ready to end the call.

## 2022-01-08 IMAGING — CT CT HEAD W/O CM
3 series · 15 of 46 positions shown, 18 images · non-contrast
Comparison: None.

CLINICAL DATA: Headache, new or worsening, pregnant

EXAM:
CT HEAD WITHOUT CONTRAST
TECHNIQUE: Contiguous axial images were obtained from the base of the skull
through the vertex without intravenous contrast.

[Series 3: head 5.0 h30s · axial · 0.41mm/px · z∈[+516,+636]mm · 9 of 29 slices shown, 12 images]
[im 3/29  brain]
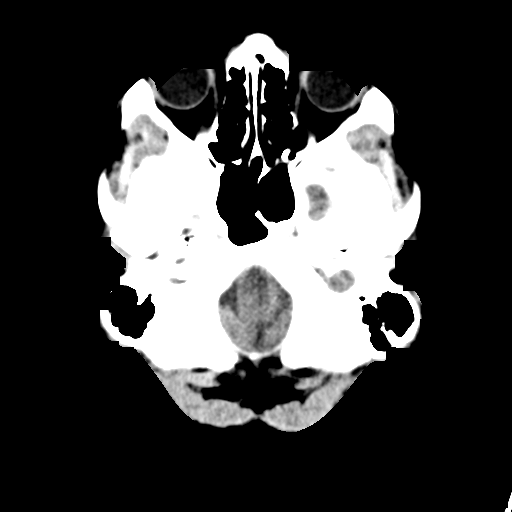
[im 3/29  bone]
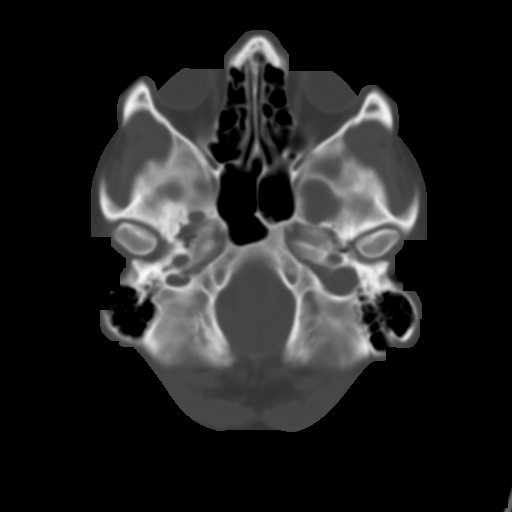
[im 6/29  brain]
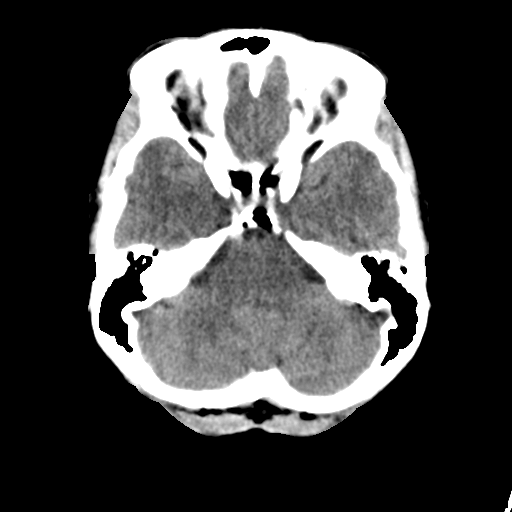
[im 9/29  brain]
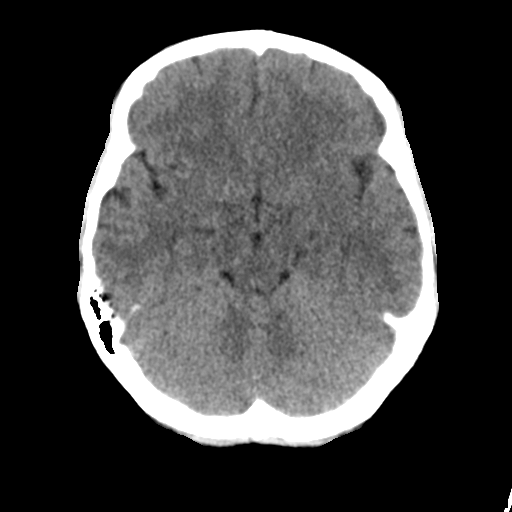
[im 12/29  brain]
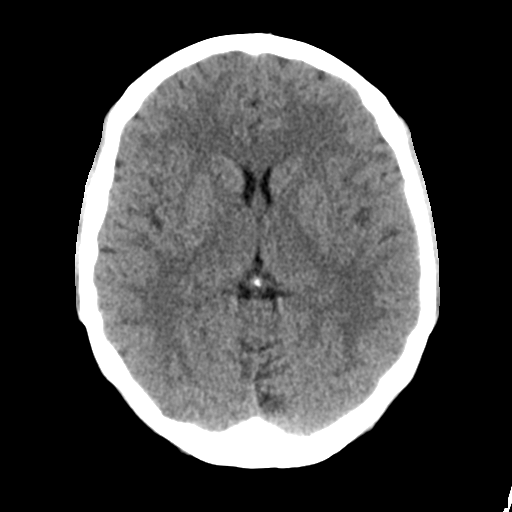
[im 15/29  brain]
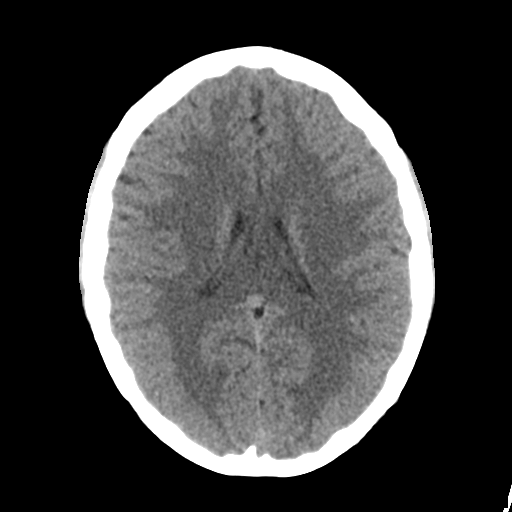
[im 15/29  bone]
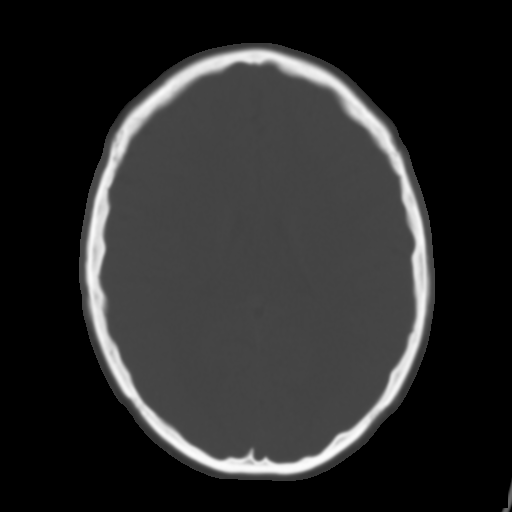
[im 18/29  brain]
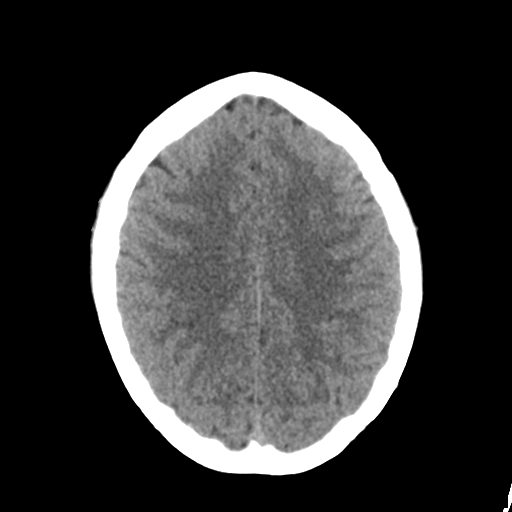
[im 21/29  brain]
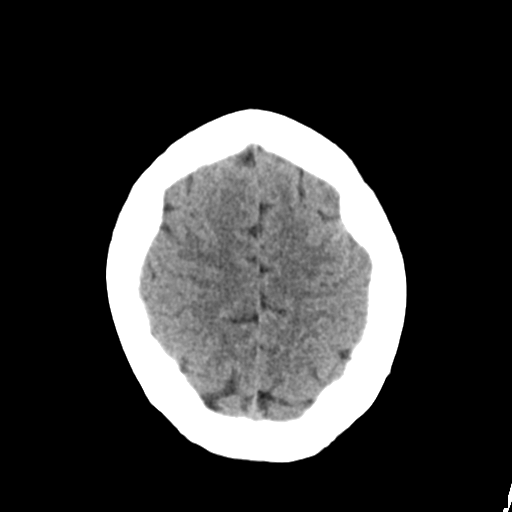
[im 24/29  brain]
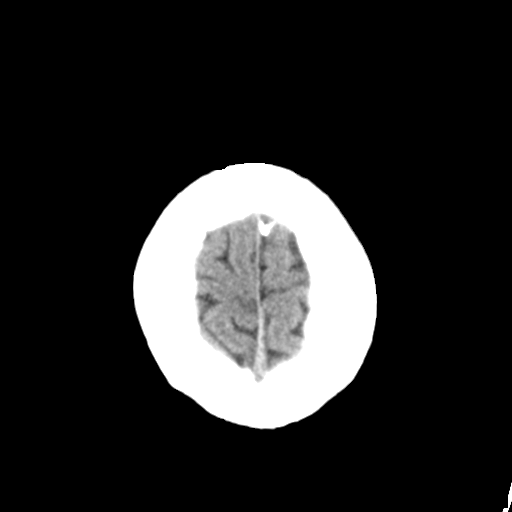
[im 27/29  brain]
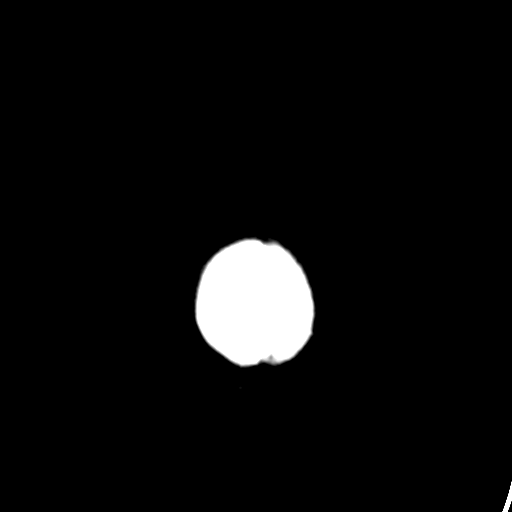
[im 27/29  bone]
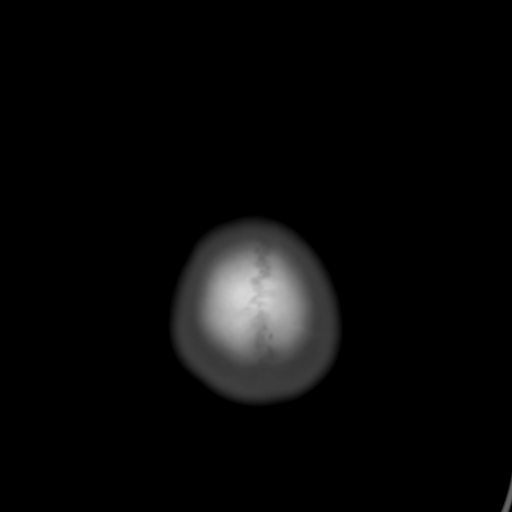

[Series 5: head 3.0 mpr cor · coronal · 0.27mm/px · 3 of 62 slices shown]
[im 21/62  brain]
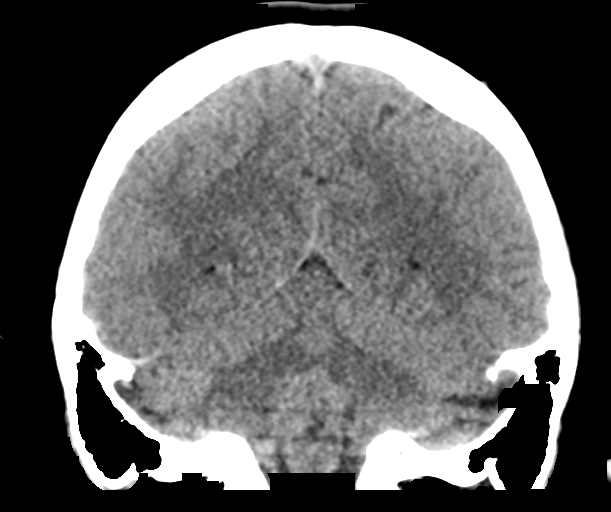
[im 28/62  brain]
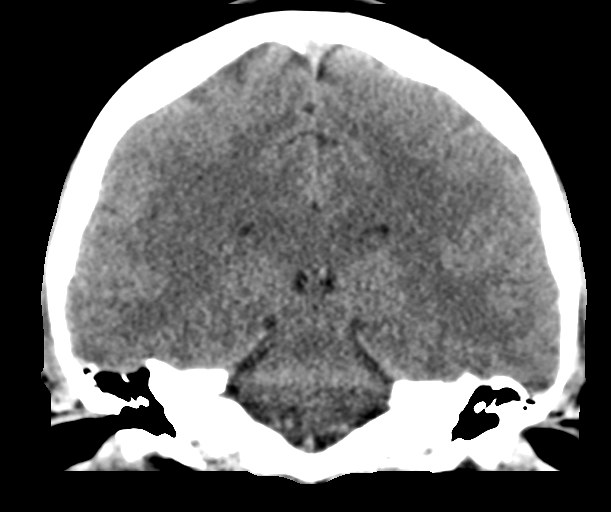
[im 34/62  brain]
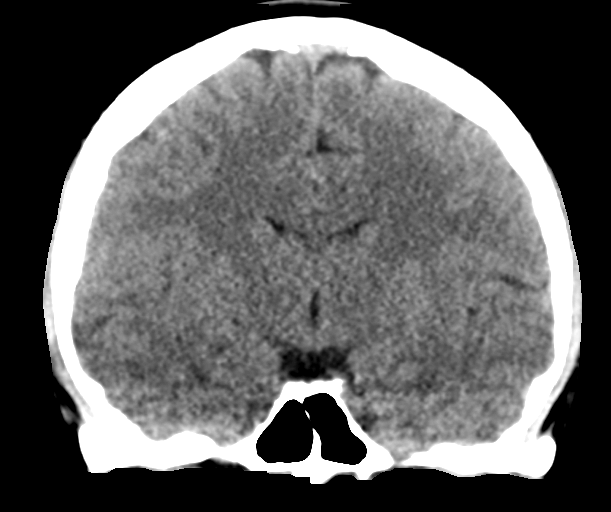

[Series 6: head 3.0 mpr sag · sagittal · 0.30mm/px · 3 of 53 slices shown]
[im 18/53  brain]
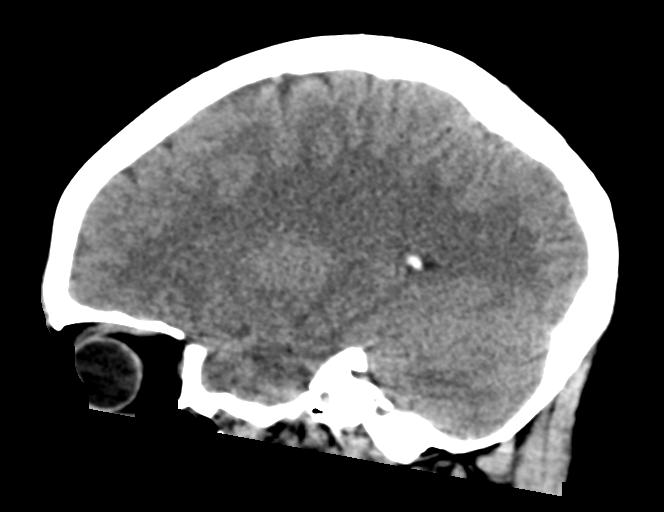
[im 27/53  brain]
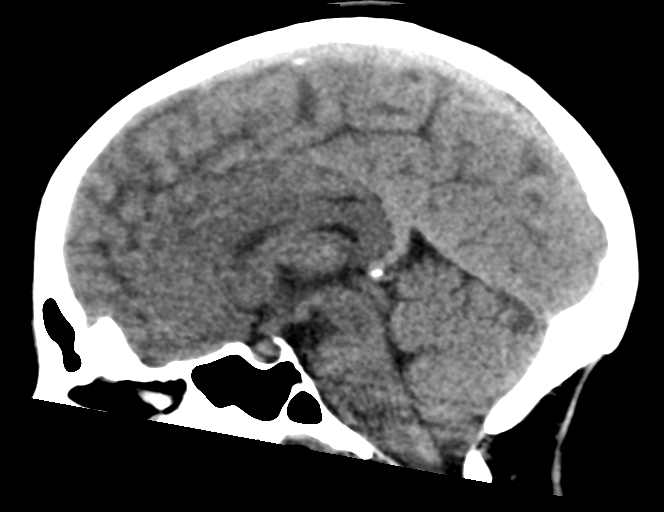
[im 35/53  brain]
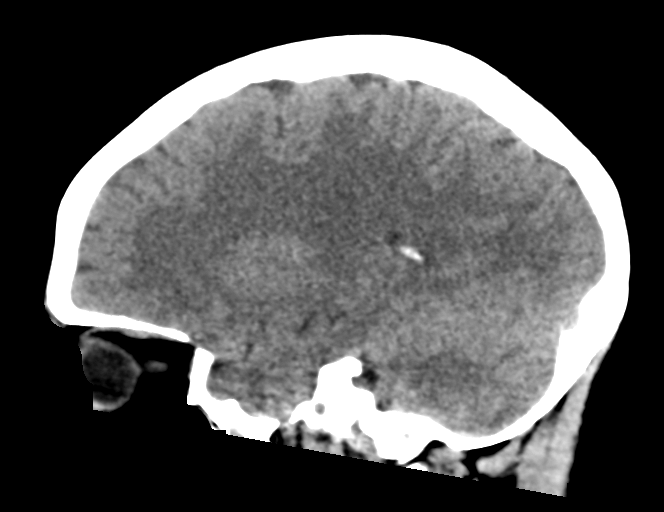

[15 of 46 positions shown; findings below may reference images not displayed]

FINDINGS: Brain: No acute intracranial abnormality. Specifically, no
hemorrhage, hydrocephalus, mass lesion, acute infarction, or
significant intracranial injury.

Vascular: No hyperdense vessel or unexpected calcification.

Skull: No acute calvarial abnormality.

Sinuses/Orbits: No acute findings

Other: None
IMPRESSION: Normal study.

## 2024-02-14 ENCOUNTER — Ambulatory Visit: Payer: Self-pay | Admitting: Certified Nurse Midwife

## 2024-02-21 ENCOUNTER — Other Ambulatory Visit: Payer: Self-pay

## 2024-02-21 ENCOUNTER — Ambulatory Visit (INDEPENDENT_AMBULATORY_CARE_PROVIDER_SITE_OTHER): Payer: Self-pay | Admitting: Certified Nurse Midwife

## 2024-02-21 ENCOUNTER — Encounter: Payer: Self-pay | Admitting: Certified Nurse Midwife

## 2024-02-21 VITALS — BP 110/70 | HR 75 | Wt 160.0 lb

## 2024-02-21 DIAGNOSIS — Z3169 Encounter for other general counseling and advice on procreation: Secondary | ICD-10-CM

## 2024-02-21 NOTE — Progress Notes (Signed)
 History:  Ms. Julie Fitzgerald is a 28 y.o. [id number] who presents to clinic today for repeat pap and to establish care so she can come here for parallel care once she is pregnant (will begin trying for pregnancy in the next two months). No complaints. Does not have insurance.  The following portions of the patient's history were reviewed and updated as appropriate: allergies, current medications, family history, past medical history, social history, past surgical history and problem list.  Review of Systems:  Pertinent items noted in HPI and remainder of comprehensive ROS otherwise negative.   Objective:  Physical Exam BP 110/70   Pulse 75   Wt 160 lb (72.6 kg)   LMP 02/11/2024   Breastfeeding No   BMI 27.46 kg/m  Physical Exam Vitals and nursing note reviewed. Exam conducted with a chaperone present.  Constitutional:      Appearance: Normal appearance. She is normal weight.  Cardiovascular:     Rate and Rhythm: Normal rate and regular rhythm.  Pulmonary:     Effort: Pulmonary effort is normal.  Chest:     Chest wall: No mass or tenderness.  Breasts:    Breasts are symmetrical.     Right: Normal.     Left: Normal.  Abdominal:     Palpations: Abdomen is soft.     Tenderness: There is no abdominal tenderness.  Musculoskeletal:        General: Normal range of motion.  Skin:    General: Skin is warm and dry.     Capillary Refill: Capillary refill takes less than 2 seconds.  Neurological:     Mental Status: She is alert and oriented to person, place, and time.  Psychiatric:        Mood and Affect: Mood normal.        Behavior: Behavior normal.    Labs and Imaging No results found for this or any previous visit (from the past 24 hours).  No results found.   Assessment & Plan:  1. 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 - Discussed importance of nutrition and pelvic floor physical  therapy before/during pregnancy. Is very physically active and eats well. Had two difficult deliveries with back labor, and a history of preterm deliveries. - Plans for a homebirth with CNM co-care.  Follow up PRN.  Derick Fleeting, CNM 02/21/2024 1:32 PM

## 2024-04-03 ENCOUNTER — Ambulatory Visit: Payer: Self-pay

## 2024-05-02 ENCOUNTER — Other Ambulatory Visit: Payer: Self-pay | Admitting: Certified Nurse Midwife

## 2024-05-02 DIAGNOSIS — Z3200 Encounter for pregnancy test, result unknown: Secondary | ICD-10-CM

## 2024-05-02 NOTE — Progress Notes (Signed)
 Positive home UPTs with amenorrhea, requests trended HCG. Having some brown discharge, no pain today. MAU precautions given.  Cornell Finder, CNM, MSN, IBCLC Certified Nurse Midwife, [facility]

## 2024-05-06 ENCOUNTER — Inpatient Hospital Stay (HOSPITAL_COMMUNITY): Payer: Self-pay

## 2024-05-06 ENCOUNTER — Other Ambulatory Visit: Payer: Self-pay

## 2024-05-06 ENCOUNTER — Encounter (HOSPITAL_COMMUNITY): Payer: Self-pay | Admitting: Obstetrics and Gynecology

## 2024-05-06 ENCOUNTER — Inpatient Hospital Stay (HOSPITAL_COMMUNITY)
Admission: AD | Admit: 2024-05-06 | Discharge: 2024-05-06 | Disposition: A | Discharge status: Home / Self Care | Payer: Self-pay | Attending: Obstetrics and Gynecology | Admitting: Obstetrics and Gynecology

## 2024-05-06 DIAGNOSIS — O3680X0 Pregnancy with inconclusive fetal viability, not applicable or unspecified: Secondary | ICD-10-CM

## 2024-05-06 DIAGNOSIS — O209 Hemorrhage in early pregnancy, unspecified: Secondary | ICD-10-CM | POA: Insufficient documentation

## 2024-05-06 DIAGNOSIS — Z3A01 Less than 8 weeks gestation of pregnancy: Secondary | ICD-10-CM

## 2024-05-06 DIAGNOSIS — R1031 Right lower quadrant pain: Secondary | ICD-10-CM | POA: Insufficient documentation

## 2024-05-06 DIAGNOSIS — O26891 Other specified pregnancy related conditions, first trimester: Secondary | ICD-10-CM | POA: Insufficient documentation

## 2024-05-06 LAB — CBC
HCT: 37.9 % (ref 36.0–46.0)
Hemoglobin: 12.3 g/dL (ref 12.0–15.0)
MCH: 29.9 pg (ref 26.0–34.0)
MCHC: 32.5 g/dL (ref 30.0–36.0)
MCV: 92 fL (ref 80.0–100.0)
Platelets: 259 K/uL (ref 150–400)
RBC: 4.12 MIL/uL (ref 3.87–5.11)
RDW: 13.2 % (ref 11.5–15.5)
WBC: 6.8 K/uL (ref 4.0–10.5)
nRBC: 0 % (ref 0.0–0.2)

## 2024-05-06 LAB — HCG, QUANTITATIVE, PREGNANCY: hCG, Beta Chain, Quant, S: 21 m[IU]/mL — ABNORMAL HIGH (ref <5)

## 2024-05-06 LAB — COMPREHENSIVE METABOLIC PANEL WITH GFR
ALT: 15 U/L (ref 0–44)
AST: 21 U/L (ref 15–41)
Albumin: 4.1 g/dL (ref 3.5–5.0)
Alkaline Phosphatase: 43 U/L (ref 38–126)
Anion gap: 10 (ref 5–15)
BUN: 9 mg/dL (ref 6–20)
CO2: 26 mmol/L (ref 22–32)
Calcium: 9.3 mg/dL (ref 8.9–10.3)
Chloride: 102 mmol/L (ref 98–111)
Creatinine, Ser: 0.85 mg/dL (ref 0.44–1.00)
GFR, Estimated: >60 mL/min (ref >60)
Glucose, Bld: 106 mg/dL — ABNORMAL HIGH (ref 70–99)
Potassium: 3.9 mmol/L (ref 3.5–5.1)
Sodium: 138 mmol/L (ref 135–145)
Total Bilirubin: 0.6 mg/dL (ref 0.0–1.2)
Total Protein: 7 g/dL (ref 6.5–8.1)

## 2024-05-06 LAB — POCT PREGNANCY, URINE: Preg Test, Ur: POSITIVE — AB

## 2024-05-06 LAB — ABO/RH: ABO/RH(D): A POS

## 2024-05-06 NOTE — Discharge Instructions (Signed)
 You were seen in the MAU for abdominal pain. We did blood tests and an ultrasound. You are pregnant, but at present we are not sure if you have an ectopic pregnancy, a normal pregnancy, or a miscarriage. To determine this you will need to have a repeat test of your pregnancy hormone level done in two days.   If you develop severe and progressively worsening abdominal pain, heavy vaginal bleeding soaking >1 pad/hour, or fever, return to the MAU or the nearest hospital immediately.

## 2024-05-06 NOTE — MAU Note (Signed)
 Julie Fitzgerald is a 28 y.o. at Unknown here in MAU reporting: took HPT on Tuesday  and it was positive.  Started bleeding Thursday. Had been light spotting to medium. . Today she started cramping on right side and bleeding through her tampon.   LMP: 04/04/2024 Onset of complaint: Thursday Pain score: 3-4 Vitals:   05/06/24 1350  BP: 111/64  Pulse: (!) 57  Resp: 18  Temp: 98.3 F (36.8 C)     FHT: n/a  Lab orders placed from triage: UPT, HCG

## 2024-05-06 NOTE — MAU Provider Note (Signed)
 History     [id number]  Arrival date and time: 05/06/24 1239    Chief Complaint  Patient presents with   Vaginal Bleeding     HPI Julie Fitzgerald is a 28 y.o. at [redacted]w[redacted]d, who presents for abdominal pain.   Patient reports positive home UPT last week Had vaginal bleeding that started on 05/02/2024 Went [facility] and had HCG drawn on 05/03/2024, reports it was 47 Then today began to have sharp intermittent RLQ pain that radiated to her back Consulted with her CNM and was instructed to come to MAU for evaluation   --/--/PENDING (07/14 1610)  OB History     Gravida  3   Para  2   Term  1   Preterm  1   AB      Living  2      SAB      IAB      Ectopic      Multiple  0   Live Births  2           Past Medical History:  Diagnosis Date   ADD (attention deficit disorder)    Asthma    Environmental allergies    Iron deficiency     Past Surgical History:  Procedure Laterality Date   NO PAST SURGERIES     WISDOM TOOTH EXTRACTION      No family history on file.  Social History   Socioeconomic History   Marital status: Married    Spouse name: Not on file   Number of children: Not on file   Years of education: Not on file   Highest education level: Not on file  Occupational History   Not on file  Tobacco Use   Smoking status: Never   Smokeless tobacco: Never  Vaping Use   Vaping status: Never Used  Substance and Sexual Activity   Alcohol use: No   Drug use: No   Sexual activity: Yes  Other Topics Concern   Not on file  Social History Narrative   Not on file   Social Drivers of Health   Financial Resource Strain: Low Risk  (10/04/2022)   Received [facility]   Overall Financial Resource Strain (CARDIA)    Difficulty of Paying Living Expenses: Not very hard  Food Insecurity: No Food Insecurity (02/21/2024)   Hunger Vital Sign    Worried About Running Out of Food in the Last Year: Never true    Ran Out of Food in the Last  Year: Never true  Transportation Needs: No Transportation Needs (02/21/2024)   PRAPARE - Administrator, Civil Service (Medical): No    Lack of Transportation (Non-Medical): No  Physical Activity: Sufficiently Active (10/04/2022)   Received from [facility]   Exercise Vital Sign    On average, how many days per week do you engage in moderate to strenuous exercise (like a brisk walk)?: 4 days    On average, how many minutes do you engage in exercise at this level?: 60 min  Stress: Stress Concern Present (10/04/2022)   Received from [facility] - Occupational Stress Questionnaire    Feeling of Stress : Very much  Social Connections: Socially Integrated (10/04/2022)   Received from [facility]   Social Network    How would you rate your social network (family, work, friends)?: Good participation with social networks  Intimate Partner Violence: Not At Risk (10/04/2022)   Received from  [facility]   HITS    Over the last 12 months how often did your partner physically hurt you?: Never    Over the last 12 months how often did your partner insult you or talk down to you?: Never    Over the last 12 months how often did your partner threaten you with physical harm?: Never    Over the last 12 months how often did your partner scream or curse at you?: Never    Allergies  Allergen Reactions   Amoxicillin Rash    Has patient had a PCN reaction causing immediate rash, facial/tongue/throat swelling, SOB or lightheadedness with hypotension: Yes Has patient had a PCN reaction causing severe rash involving mucus membranes or skin necrosis: Yes Has patient had a PCN reaction that required hospitalization No Has patient had a PCN reaction occurring within the last 10 years: No If all of the above answers are NO, then may proceed with Cephalosporin use.     No current facility-administered medications on file prior to encounter.   No  current outpatient medications on file prior to encounter.     ROS Pertinent positives and negative per HPI, all others reviewed and negative  Physical Exam   BP 111/64   Pulse (!) 57   Temp 98.3 F (36.8 C)   Resp 18   Ht 5' 4 (1.626 m)   Wt 73.3 kg   LMP 04/04/2024   BMI 27.74 kg/m   Patient Vitals for the past 24 hrs:  BP Temp Pulse Resp Height Weight  05/06/24 1350 111/64 98.3 F (36.8 C) (!) 57 18 5' 4 (1.626 m) 73.3 kg    Physical Exam Vitals reviewed.  Constitutional:      General: She is not in acute distress.    Appearance: She is well-developed. She is not diaphoretic.  Eyes:     General: No scleral icterus. Pulmonary:     Effort: Pulmonary effort is normal. No respiratory distress.  Skin:    General: Skin is warm and dry.  Neurological:     Mental Status: She is alert.     Coordination: Coordination normal.     Comments: Ambulating without discomfort      Cervical Exam    Bedside Ultrasound Not performed.  My interpretation: n/a   Labs Results for orders placed or performed during the hospital encounter of 05/06/24 (from the past 24 hours)  hCG, quantitative, pregnancy     Status: Abnormal   Collection Time: 05/06/24  2:27 PM  Result Value Ref Range   hCG, Beta Chain, Quant, S 21 (H) <5 mIU/mL  Pregnancy, urine POC     Status: Abnormal   Collection Time: 05/06/24  2:34 PM  Result Value Ref Range   Preg Test, Ur POSITIVE (A) NEGATIVE  ABO/Rh     Status: None (Preliminary result)   Collection Time: 05/06/24  4:10 PM  Result Value Ref Range   ABO/RH(D) PENDING   CBC     Status: None   Collection Time: 05/06/24  4:12 PM  Result Value Ref Range   WBC 6.8 4.0 - 10.5 K/uL   RBC 4.12 3.87 - 5.11 MIL/uL   Hemoglobin 12.3 12.0 - 15.0 g/dL   HCT 62.0 63.9 - 53.9 %   MCV 92.0 80.0 - 100.0 fL   MCH 29.9 26.0 - 34.0 pg   MCHC 32.5 30.0 - 36.0 g/dL   RDW 86.7 88.4 - 84.4 %   Platelets 259 150 - 400 K/uL  nRBC 0.0 0.0 - 0.2 %   Comprehensive metabolic panel with GFR     Status: Abnormal   Collection Time: 05/06/24  4:12 PM  Result Value Ref Range   Sodium 138 135 - 145 mmol/L   Potassium 3.9 3.5 - 5.1 mmol/L   Chloride 102 98 - 111 mmol/L   CO2 26 22 - 32 mmol/L   Glucose, Bld 106 (H) 70 - 99 mg/dL   BUN 9 6 - 20 mg/dL   Creatinine, Ser 9.14 0.44 - 1.00 mg/dL   Calcium 9.3 8.9 - 89.6 mg/dL   Total Protein 7.0 6.5 - 8.1 g/dL   Albumin 4.1 3.5 - 5.0 g/dL   AST 21 15 - 41 U/L   ALT 15 0 - 44 U/L   Alkaline Phosphatase 43 38 - 126 U/L   Total Bilirubin 0.6 0.0 - 1.2 mg/dL   GFR, Estimated >39 >39 mL/min   Anion gap 10 5 - 15    Imaging US  OB LESS THAN 14 WEEKS WITH OB TRANSVAGINAL Result Date: 05/06/2024 CLINICAL DATA:  [id number] Pregnancy, location unknown [id number]. Gestational age by last menstrual period 4 weeks and 4 days. Estimated due date 01/09/2025. Last menstrual period 04/04/2024 EXAM: OBSTETRIC <14 WK US  AND TRANSVAGINAL OB US  TECHNIQUE: Both transabdominal and transvaginal ultrasound examinations were performed for complete evaluation of the gestation as well as the maternal uterus, adnexal regions, and pelvic cul-de-sac. Transvaginal technique was performed to assess early pregnancy. COMPARISON:  None Available. FINDINGS: Intrauterine gestational sac: None Yolk sac:  Not Visualized. Embryo:  Not Visualized. Cardiac Activity: Not Visualized. Maternal uterus/adnexae: Bilateral ovaries are unremarkable. Endometrium measures 6 mm. Other: No free fluid. IMPRESSION: Non-localization of the pregnancy on this scan. No intrauterine gestational sac. No abnormal ovarian or adnexal masses. Differential diagnosis includes intrauterine gestation too early to visualize, spontaneous abortion, or occult ectopic gestation. Recommend close clinical follow-up and serial serum beta-HCG monitoring, with repeat obstetric scan as warranted by beta-HCG levels and clinical assessment. Electronically Signed   By: Morgane  Naveau M.D.    On: 05/06/2024 17:19    MAU Course  Procedures Lab Orders         hCG, quantitative, pregnancy         CBC         Comprehensive metabolic panel with GFR         Pregnancy, urine POC    No orders of the defined types were placed in this encounter.  Imaging Orders         US  OB LESS THAN 14 WEEKS WITH OB TRANSVAGINAL     MDM Moderate (Level 3-4)  Assessment and Plan  #Pregnancy of unknown location, Vaginal bleeding in pregnancy, first trimester #[redacted] weeks gestation of pregnancy Pregnancy of unknown location. Discussed with patient that the possibilities include ectopic pregnancy, normal pregnancy, or miscarriage. We discussed need to trend HCG to help determine the outcome of this pregnancy. Per patient preference she will get follow up lab at [facility] and discuss with her CNM. Emphasized that ectopic pregnancy is a life threatening condition if not diagnosed and treated in a timely manner. Reviewed MAU return precautions of severe and progressively worsening abdominal pain, heavy vaginal bleeding soaking >1 pad/hour, or fever.   Dispo: discharged to home in stable condition    Donnice CHRISTELLA Carolus, MD/MPH 05/06/24 5:30 PM  Allergies as of 05/06/2024       Reactions   Amoxicillin Rash   Has patient had a PCN reaction causing immediate  rash, facial/tongue/throat swelling, SOB or lightheadedness with hypotension: Yes Has patient had a PCN reaction causing severe rash involving mucus membranes or skin necrosis: Yes Has patient had a PCN reaction that required hospitalization No Has patient had a PCN reaction occurring within the last 10 years: No If all of the above answers are NO, then may proceed with Cephalosporin use.        Medication List    You have not been prescribed any medications.

## 2024-05-08 ENCOUNTER — Other Ambulatory Visit: Payer: Self-pay

## 2024-07-23 ENCOUNTER — Ambulatory Visit: Payer: Self-pay
